# Patient Record
Sex: Female | Born: 1940 | Race: White | Hispanic: No | Marital: Married | Smoking: Never smoker
Health system: Southern US, Community
[De-identification: ages and names within clinical notes are randomized; demographics above are authoritative.]

## PROBLEM LIST (undated history)

## (undated) DIAGNOSIS — E039 Hypothyroidism, unspecified: Secondary | ICD-10-CM

## (undated) DIAGNOSIS — Z923 Personal history of irradiation: Secondary | ICD-10-CM

## (undated) DIAGNOSIS — I10 Essential (primary) hypertension: Secondary | ICD-10-CM

## (undated) DIAGNOSIS — Z87442 Personal history of urinary calculi: Secondary | ICD-10-CM

## (undated) DIAGNOSIS — C50919 Malignant neoplasm of unspecified site of unspecified female breast: Secondary | ICD-10-CM

## (undated) DIAGNOSIS — C801 Malignant (primary) neoplasm, unspecified: Secondary | ICD-10-CM

## (undated) HISTORY — PX: STENT PLACE LEFT URETER (ARMC HX): HXRAD1254

## (undated) HISTORY — PX: CHOLECYSTECTOMY: SHX55

## (undated) HISTORY — PX: OTHER SURGICAL HISTORY: SHX169

## (undated) HISTORY — PX: ABDOMINAL HYSTERECTOMY: SHX81

## (undated) HISTORY — PX: BREAST BIOPSY: SHX20

## (undated) HISTORY — PX: BREAST LUMPECTOMY: SHX2

---

## 1999-02-06 ENCOUNTER — Encounter: Payer: Self-pay | Admitting: *Deleted

## 1999-02-06 ENCOUNTER — Encounter: Admission: RE | Admit: 1999-02-06 | Discharge: 1999-02-06 | Payer: Self-pay | Admitting: *Deleted

## 1999-05-18 ENCOUNTER — Encounter (INDEPENDENT_AMBULATORY_CARE_PROVIDER_SITE_OTHER): Payer: Self-pay | Admitting: Specialist

## 1999-05-18 ENCOUNTER — Ambulatory Visit (HOSPITAL_COMMUNITY): Admission: RE | Admit: 1999-05-18 | Discharge: 1999-05-18 | Payer: Self-pay | Admitting: Gastroenterology

## 2000-02-08 ENCOUNTER — Encounter: Admission: RE | Admit: 2000-02-08 | Discharge: 2000-02-08 | Payer: Self-pay | Admitting: General Surgery

## 2000-02-08 ENCOUNTER — Encounter: Payer: Self-pay | Admitting: General Surgery

## 2001-02-10 ENCOUNTER — Encounter: Admission: RE | Admit: 2001-02-10 | Discharge: 2001-02-10 | Payer: Self-pay | Admitting: General Surgery

## 2001-02-10 ENCOUNTER — Encounter: Payer: Self-pay | Admitting: General Surgery

## 2001-05-21 ENCOUNTER — Encounter: Payer: Self-pay | Admitting: General Surgery

## 2001-05-21 ENCOUNTER — Encounter: Admission: RE | Admit: 2001-05-21 | Discharge: 2001-05-21 | Payer: Self-pay | Admitting: General Surgery

## 2001-05-26 ENCOUNTER — Encounter: Admission: RE | Admit: 2001-05-26 | Discharge: 2001-05-26 | Payer: Self-pay | Admitting: Internal Medicine

## 2001-05-26 ENCOUNTER — Encounter: Payer: Self-pay | Admitting: Internal Medicine

## 2002-02-11 ENCOUNTER — Encounter: Admission: RE | Admit: 2002-02-11 | Discharge: 2002-02-11 | Payer: Self-pay | Admitting: Oncology

## 2002-02-11 ENCOUNTER — Encounter: Payer: Self-pay | Admitting: Oncology

## 2003-02-15 ENCOUNTER — Encounter: Admission: RE | Admit: 2003-02-15 | Discharge: 2003-02-15 | Payer: Self-pay | Admitting: Internal Medicine

## 2004-02-21 ENCOUNTER — Encounter: Admission: RE | Admit: 2004-02-21 | Discharge: 2004-02-21 | Payer: Self-pay | Admitting: Internal Medicine

## 2005-02-19 ENCOUNTER — Encounter: Admission: RE | Admit: 2005-02-19 | Discharge: 2005-02-19 | Payer: Self-pay | Admitting: Internal Medicine

## 2005-03-02 ENCOUNTER — Encounter: Admission: RE | Admit: 2005-03-02 | Discharge: 2005-03-02 | Payer: Self-pay | Admitting: Internal Medicine

## 2006-02-08 ENCOUNTER — Encounter: Admission: RE | Admit: 2006-02-08 | Discharge: 2006-02-08 | Payer: Self-pay | Admitting: General Surgery

## 2006-03-08 ENCOUNTER — Encounter: Admission: RE | Admit: 2006-03-08 | Discharge: 2006-03-08 | Payer: Self-pay | Admitting: Internal Medicine

## 2006-11-05 ENCOUNTER — Emergency Department (HOSPITAL_COMMUNITY): Admission: EM | Admit: 2006-11-05 | Discharge: 2006-11-05 | Payer: Self-pay | Admitting: Emergency Medicine

## 2006-11-05 ENCOUNTER — Ambulatory Visit (HOSPITAL_BASED_OUTPATIENT_CLINIC_OR_DEPARTMENT_OTHER): Admission: RE | Admit: 2006-11-05 | Discharge: 2006-11-05 | Payer: Self-pay | Admitting: Urology

## 2006-11-14 ENCOUNTER — Ambulatory Visit (HOSPITAL_COMMUNITY): Admission: RE | Admit: 2006-11-14 | Discharge: 2006-11-14 | Payer: Self-pay | Admitting: Urology

## 2006-12-10 ENCOUNTER — Encounter: Admission: RE | Admit: 2006-12-10 | Discharge: 2006-12-10 | Payer: Self-pay | Admitting: Internal Medicine

## 2007-03-17 ENCOUNTER — Encounter: Admission: RE | Admit: 2007-03-17 | Discharge: 2007-03-17 | Payer: Self-pay | Admitting: Internal Medicine

## 2007-11-18 IMAGING — US US ABDOMEN LIMITED
1 series · 10 of 10 positions shown · non-contrast
Comparison: none

CLINICAL DATA: 1 cm round low density anterior falciform ligament, left lobe liver, on [HOSPITAL] abdominal CT, 11/05/06.
 LIMITED ABDOMINAL ULTRASOUND:

[Series 1: us abdomen limited · 0.22mm/px · 10 acquisitions, 10 frames shown]
[im 1/10]
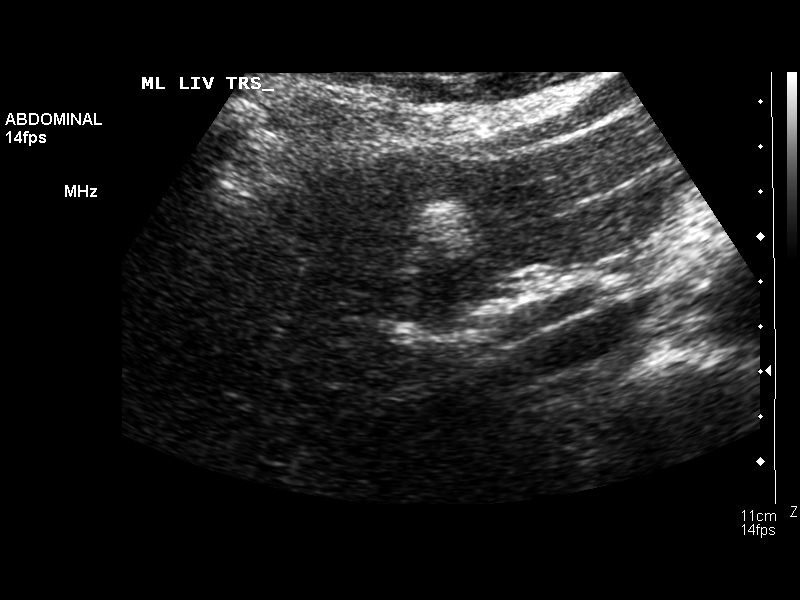
[im 2/10]
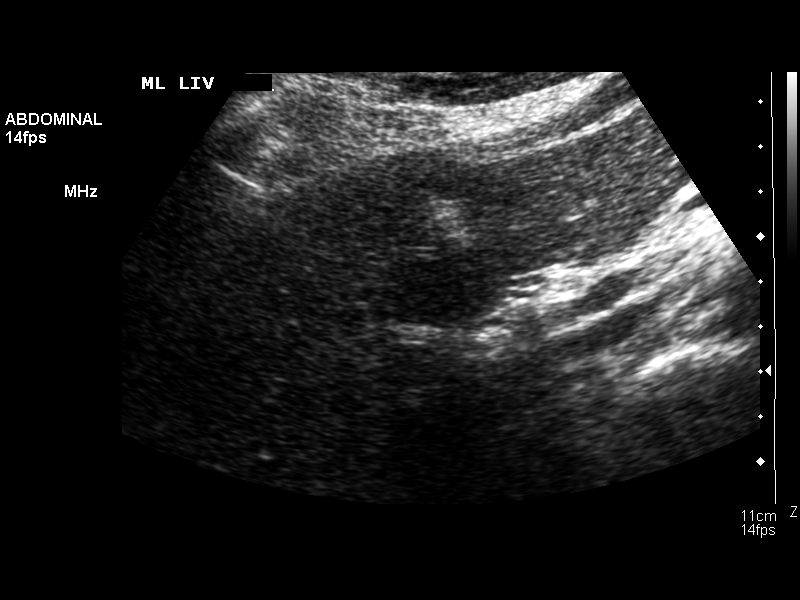
[im 3/10]
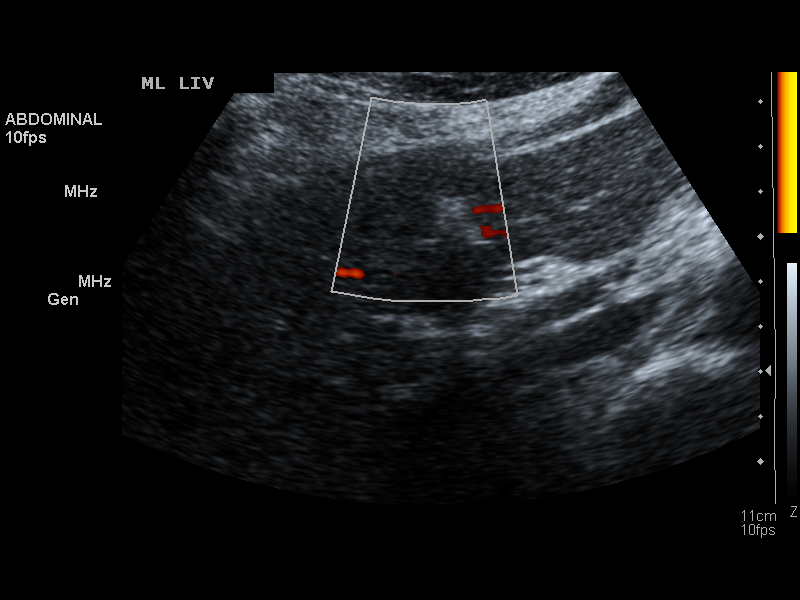
[im 4/10]
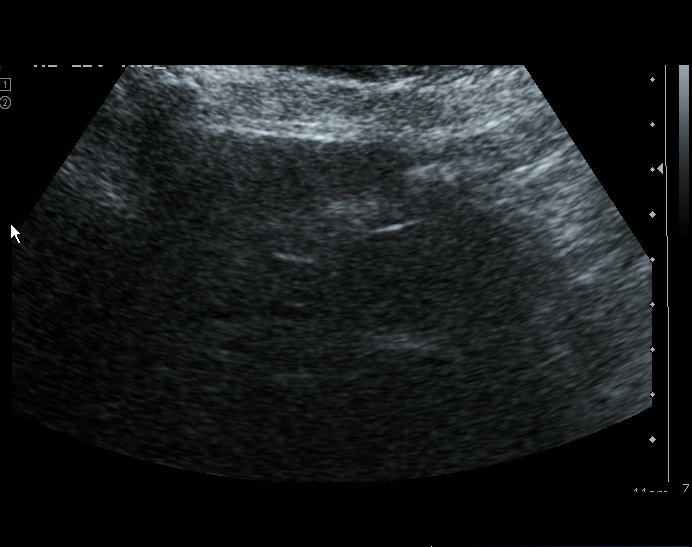
[im 5/10]
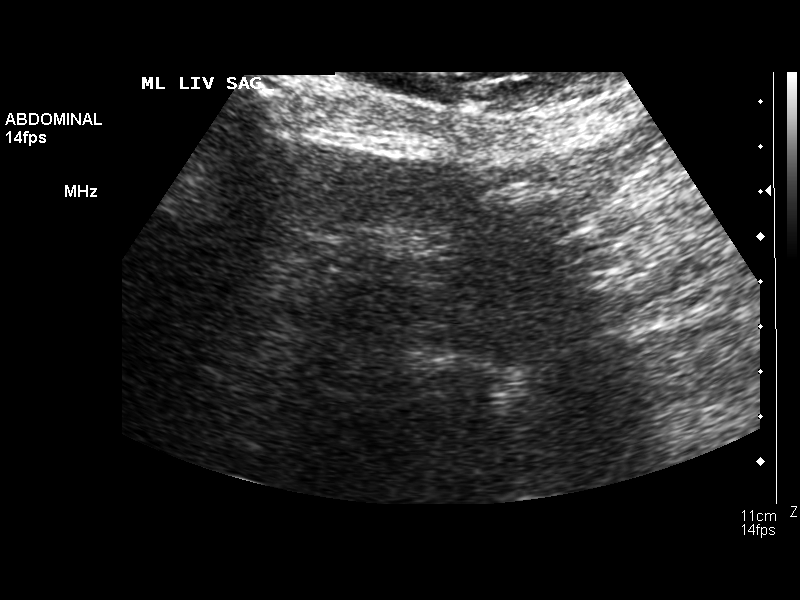
[im 6/10]
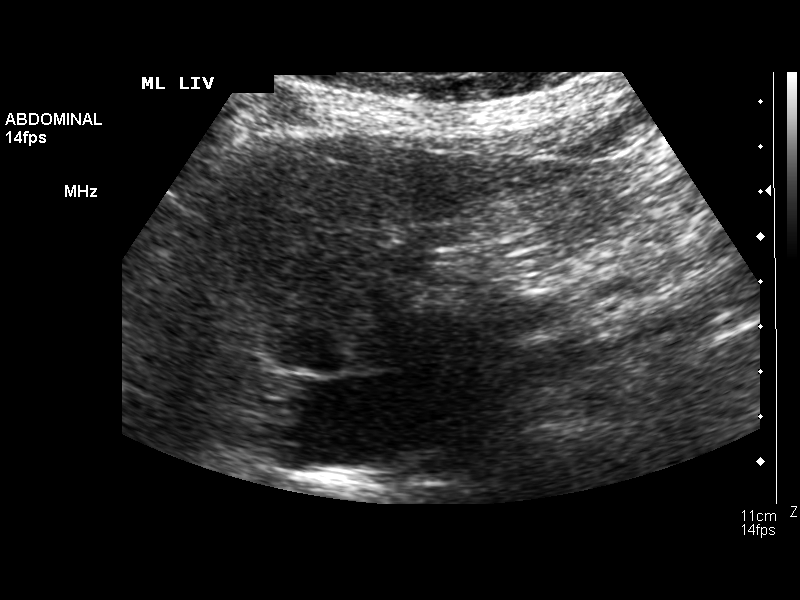
[im 7/10]
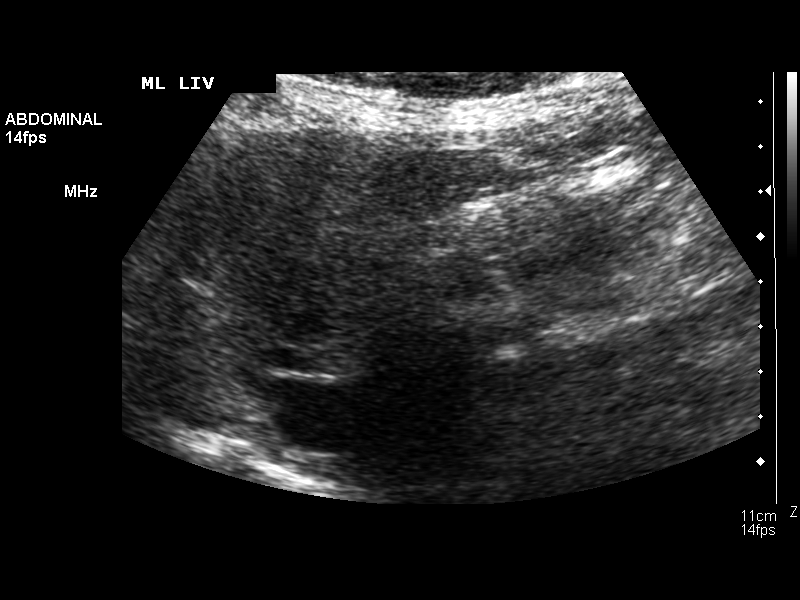
[im 8/10]
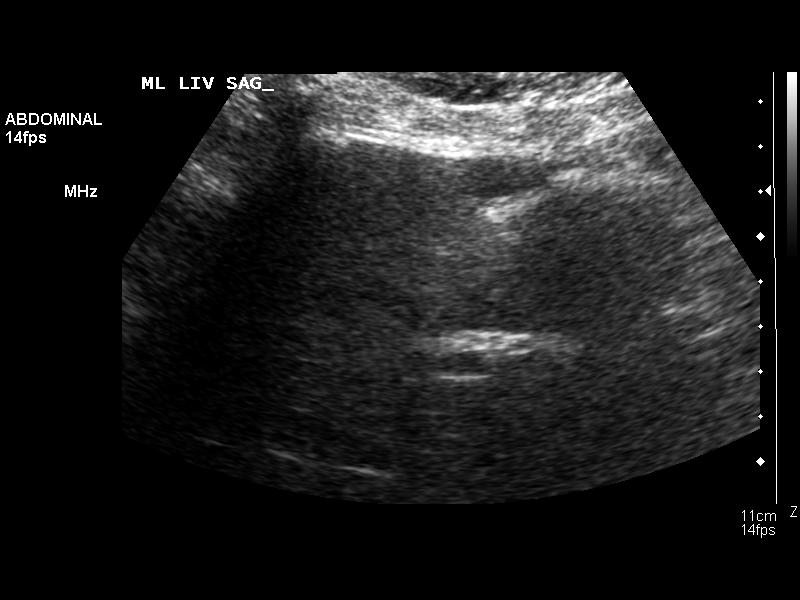
[im 9/10]
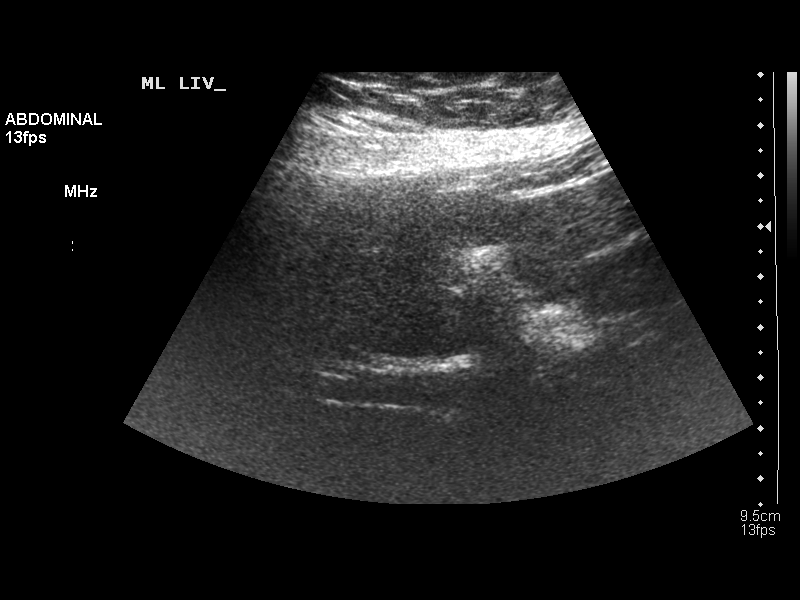
[im 10/10]
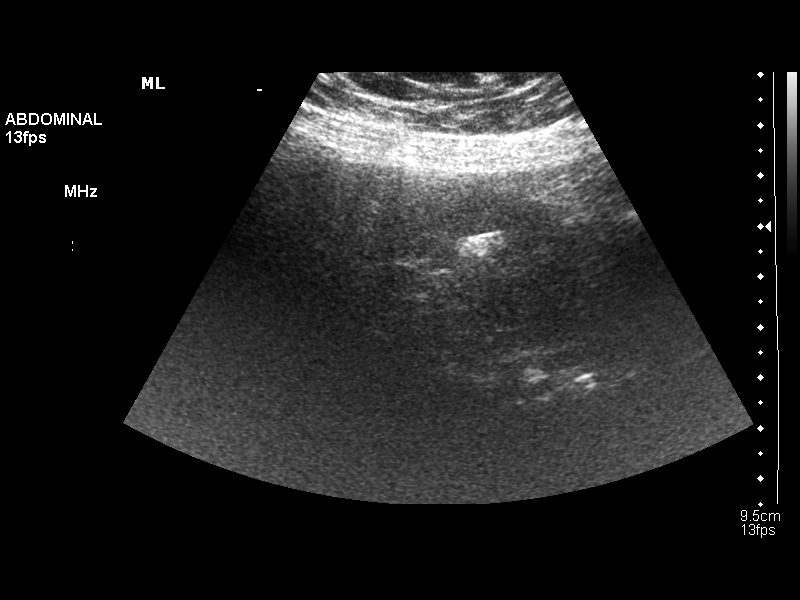

[10 of 10 positions shown; findings below may reference images not displayed]

FINDINGS: Ultrasound targeted in region of abdominal CT focus demonstrates characteristic fat along the falciform ligament.  Small area of echogenicity.  No new focal liver lesion is seen.
IMPRESSION: 1.  1 cm low density is consistent with benign fat along falciform ligament, left lobe liver.
 2.  No other solid nor cystic focal liver lesion seen.

## 2008-03-22 ENCOUNTER — Encounter: Admission: RE | Admit: 2008-03-22 | Discharge: 2008-03-22 | Payer: Self-pay | Admitting: Internal Medicine

## 2009-03-28 ENCOUNTER — Encounter: Admission: RE | Admit: 2009-03-28 | Discharge: 2009-03-28 | Payer: Self-pay | Admitting: Internal Medicine

## 2010-03-28 ENCOUNTER — Encounter
Admission: RE | Admit: 2010-03-28 | Discharge: 2010-03-28 | Payer: Self-pay | Source: Home / Self Care | Attending: Internal Medicine | Admitting: Internal Medicine

## 2010-04-01 ENCOUNTER — Encounter: Payer: Self-pay | Admitting: Internal Medicine

## 2010-07-25 NOTE — Op Note (Signed)
NAMECAITLIN, [name]                ACCOUNT NO.:  [id number]   MEDICAL RECORD NO.:  [id number]          PATIENT TYPE:  AMB   LOCATION:  [facility]                         FACILITY:  [facility]   PHYSICIAN:  [place] Service, M.D.DATE OF BIRTH:  08/03/1940   DATE OF PROCEDURE:  11/05/2006  DATE OF DISCHARGE:                               OPERATIVE REPORT   PREOPERATIVE DIAGNOSIS:  A 70 year old female, intractable flank pain,  nausea, vomiting, despite appropriate medical therapy.  Recent CT shows  stone in the region of the left UPJ.  The patient is on aspirin.  Plan  was made for double-J stent placement with lithotripsy to follow once  she has been off aspirin for about 10 days.   POSTOPERATIVE DIAGNOSIS:  A 70 year old female, intractable flank pain,  nausea, vomiting, despite appropriate medical therapy.  Recent CT shows  stone in the region of the left UPJ.  The patient is on aspirin.  Plan  was made for double-J stent placement with lithotripsy to follow once  she has been off aspirin for about 10 days.   PROCEDURE:  Cystoscopy, retrogrades, double-J stent.   ANESTHESIA:  General.   DRAINS:  6-French 26-cm double-J stent.   DESCRIPTION OF PROCEDURE:  The patient was prepped and draped in the  dorsolithotomy position.  After institution of an adequate level of  general anesthesia, well-lubricated 21-French panendoscope was gently  inserted at the urethral meatus.  Normal urethra and sphincter.  Clear  reflux right orifice; minimal reflux left orifice.  Bladder was  inspected with the 12 and 70 degree lenses.   Blocking catheter was selected, positioned at the right ureteral orifice  with gentle injection of contrast.  The patient had a somewhat tortuous  right ureter, mainly due to her scoliosis, and she had S-shaped  deformity of the ureteropelvic junction but no evidence of obstruction.  After injection of about 5-10 mL of contrast, ureter, pelvis and calyces  were all outlined.   With removal of the catheter, there was prompt  efflux and drainage over the next 3-5 minutes.  Similar technique was  used on the left side.  The patient had what appeared to be a rounded  filling defect above the vessels and a second rounded filling defect at  about the level of the UPJ consistent with the stone identified on CT.  Floppy tip guidewire was then inserted, bypassed the filling defects,  passed into what appeared to be upper pole dilated calyces.  End-hole  catheter was passed over the floppy tip guidewire with prompt efflux of  concentrated urine.  Urine was sent for culture.  Postobstructive  diuresis was allowed to subside.  A 6-French 26-cm  double-J stent was then advanced over the indwelling guidewire with  excellent pigtail formation on guidewire removal.  Bladder was drained.  Cystoscope was removed.  The patient was returned to recovery in  satisfactory condition.           ______________________________  Boston Service, M.D.     RH/MEDQ  D:  11/05/2006  T:  11/06/2006  Job:  [id number]   cc:  [place] Service, M.D.  Fax: [phone]

## 2010-12-22 LAB — URINE MICROSCOPIC-ADD ON

## 2010-12-22 LAB — I-STAT 8, (EC8 V) (CONVERTED LAB)
BUN: 19
Bicarbonate: 26.4 — ABNORMAL HIGH
Chloride: 104
Glucose, Bld: 147 — ABNORMAL HIGH
HCT: 37
Hemoglobin: 12.6
Operator id: 134391
Potassium: 4.1
Sodium: 139
TCO2: 28
pCO2, Ven: 47.6
pH, Ven: 7.352 — ABNORMAL HIGH

## 2010-12-22 LAB — ANAEROBIC CULTURE: Gram Stain: NONE SEEN

## 2010-12-22 LAB — URINALYSIS, ROUTINE W REFLEX MICROSCOPIC
Glucose, UA: NEGATIVE
Ketones, ur: 15 — AB
Nitrite: NEGATIVE
Protein, ur: 100 — AB
Specific Gravity, Urine: 1.025
Urobilinogen, UA: 0.2
pH: 5

## 2010-12-22 LAB — WOUND CULTURE
Culture: NO GROWTH
Gram Stain: NONE SEEN

## 2010-12-22 LAB — POCT HEMOGLOBIN-HEMACUE
Hemoglobin: 13.3
Operator id: 268271

## 2011-02-26 ENCOUNTER — Other Ambulatory Visit: Payer: Self-pay | Admitting: Internal Medicine

## 2011-02-26 DIAGNOSIS — Z1231 Encounter for screening mammogram for malignant neoplasm of breast: Secondary | ICD-10-CM

## 2011-04-11 ENCOUNTER — Ambulatory Visit
Admission: RE | Admit: 2011-04-11 | Discharge: 2011-04-11 | Disposition: A | Discharge status: Home / Self Care | Payer: Medicare Other | Source: Ambulatory Visit | Attending: Internal Medicine | Admitting: Internal Medicine

## 2011-04-11 DIAGNOSIS — Z1231 Encounter for screening mammogram for malignant neoplasm of breast: Secondary | ICD-10-CM

## 2012-03-10 ENCOUNTER — Other Ambulatory Visit: Payer: Self-pay | Admitting: Internal Medicine

## 2012-03-10 DIAGNOSIS — Z853 Personal history of malignant neoplasm of breast: Secondary | ICD-10-CM

## 2012-03-10 DIAGNOSIS — Z1231 Encounter for screening mammogram for malignant neoplasm of breast: Secondary | ICD-10-CM

## 2012-03-10 DIAGNOSIS — Z9889 Other specified postprocedural states: Secondary | ICD-10-CM

## 2012-04-14 ENCOUNTER — Ambulatory Visit
Admission: RE | Admit: 2012-04-14 | Discharge: 2012-04-14 | Disposition: A | Discharge status: Home / Self Care | Payer: Medicare Other | Source: Ambulatory Visit | Attending: Internal Medicine | Admitting: Internal Medicine

## 2012-04-14 DIAGNOSIS — Z1231 Encounter for screening mammogram for malignant neoplasm of breast: Secondary | ICD-10-CM

## 2012-04-14 DIAGNOSIS — Z853 Personal history of malignant neoplasm of breast: Secondary | ICD-10-CM

## 2012-04-14 DIAGNOSIS — Z9889 Other specified postprocedural states: Secondary | ICD-10-CM

## 2013-03-18 ENCOUNTER — Other Ambulatory Visit: Payer: Self-pay

## 2013-03-18 DIAGNOSIS — Z1231 Encounter for screening mammogram for malignant neoplasm of breast: Secondary | ICD-10-CM

## 2013-04-20 ENCOUNTER — Ambulatory Visit
Admission: RE | Admit: 2013-04-20 | Discharge: 2013-04-20 | Disposition: A | Discharge status: Home / Self Care | Payer: Medicare Other | Source: Ambulatory Visit

## 2013-04-20 DIAGNOSIS — Z1231 Encounter for screening mammogram for malignant neoplasm of breast: Secondary | ICD-10-CM

## 2014-03-22 ENCOUNTER — Other Ambulatory Visit: Payer: Self-pay

## 2014-03-22 DIAGNOSIS — Z1231 Encounter for screening mammogram for malignant neoplasm of breast: Secondary | ICD-10-CM

## 2014-05-03 ENCOUNTER — Encounter (INDEPENDENT_AMBULATORY_CARE_PROVIDER_SITE_OTHER): Payer: Self-pay

## 2014-05-03 ENCOUNTER — Ambulatory Visit
Admission: RE | Admit: 2014-05-03 | Discharge: 2014-05-03 | Disposition: A | Discharge status: Home / Self Care | Payer: Medicare Other | Source: Ambulatory Visit

## 2014-05-03 DIAGNOSIS — Z1231 Encounter for screening mammogram for malignant neoplasm of breast: Secondary | ICD-10-CM

## 2015-04-12 ENCOUNTER — Other Ambulatory Visit: Payer: Self-pay

## 2015-04-12 DIAGNOSIS — Z1231 Encounter for screening mammogram for malignant neoplasm of breast: Secondary | ICD-10-CM

## 2015-04-28 ENCOUNTER — Other Ambulatory Visit: Payer: Self-pay | Admitting: Orthopaedic Surgery

## 2015-04-28 DIAGNOSIS — M25562 Pain in left knee: Secondary | ICD-10-CM

## 2015-05-05 ENCOUNTER — Ambulatory Visit
Admission: RE | Admit: 2015-05-05 | Discharge: 2015-05-05 | Disposition: A | Discharge status: Home / Self Care | Payer: Medicare Other | Source: Ambulatory Visit | Attending: Orthopaedic Surgery | Admitting: Orthopaedic Surgery

## 2015-05-05 DIAGNOSIS — M25562 Pain in left knee: Secondary | ICD-10-CM

## 2015-05-07 ENCOUNTER — Emergency Department (HOSPITAL_COMMUNITY)
Admission: EM | Admit: 2015-05-07 | Discharge: 2015-05-07 | Disposition: A | Discharge status: Home / Self Care | Payer: Medicare Other | Attending: Emergency Medicine | Admitting: Emergency Medicine

## 2015-05-07 ENCOUNTER — Emergency Department (HOSPITAL_COMMUNITY): Payer: Medicare Other

## 2015-05-07 ENCOUNTER — Encounter (HOSPITAL_COMMUNITY): Payer: Self-pay | Admitting: Oncology

## 2015-05-07 DIAGNOSIS — N23 Unspecified renal colic: Secondary | ICD-10-CM

## 2015-05-07 DIAGNOSIS — Z79899 Other long term (current) drug therapy: Secondary | ICD-10-CM | POA: Diagnosis not present

## 2015-05-07 DIAGNOSIS — I10 Essential (primary) hypertension: Secondary | ICD-10-CM | POA: Insufficient documentation

## 2015-05-07 DIAGNOSIS — R109 Unspecified abdominal pain: Secondary | ICD-10-CM | POA: Diagnosis present

## 2015-05-07 HISTORY — DX: Essential (primary) hypertension: I10

## 2015-05-07 LAB — BASIC METABOLIC PANEL
Anion gap: 7 (ref 5–15)
BUN: 24 mg/dL — ABNORMAL HIGH (ref 6–20)
CO2: 27 mmol/L (ref 22–32)
CREATININE: 0.77 mg/dL (ref 0.44–1.00)
Calcium: 9.8 mg/dL (ref 8.9–10.3)
Chloride: 112 mmol/L — ABNORMAL HIGH (ref 101–111)
Glucose, Bld: 98 mg/dL (ref 65–99)
Potassium: 4.6 mmol/L (ref 3.5–5.1)
SODIUM: 146 mmol/L — AB (ref 135–145)

## 2015-05-07 LAB — URINALYSIS, ROUTINE W REFLEX MICROSCOPIC
Bilirubin Urine: NEGATIVE
Glucose, UA: NEGATIVE mg/dL
KETONES UR: NEGATIVE mg/dL
NITRITE: NEGATIVE
PH: 6 (ref 5.0–8.0)
Protein, ur: NEGATIVE mg/dL
Specific Gravity, Urine: 1.015 (ref 1.005–1.030)

## 2015-05-07 LAB — CBC WITH DIFFERENTIAL/PLATELET
BASOS PCT: 0 %
Basophils Absolute: 0 10*3/uL (ref 0.0–0.1)
EOS ABS: 0.2 10*3/uL (ref 0.0–0.7)
Eosinophils Relative: 2 %
HEMATOCRIT: 41.7 % (ref 36.0–46.0)
Hemoglobin: 13.8 g/dL (ref 12.0–15.0)
Lymphocytes Relative: 24 %
Lymphs Abs: 2 10*3/uL (ref 0.7–4.0)
MCH: 30.3 pg (ref 26.0–34.0)
MCHC: 33.1 g/dL (ref 30.0–36.0)
MCV: 91.4 fL (ref 78.0–100.0)
MONO ABS: 0.6 10*3/uL (ref 0.1–1.0)
Monocytes Relative: 7 %
NEUTROS ABS: 5.6 10*3/uL (ref 1.7–7.7)
NEUTROS PCT: 67 %
PLATELETS: 191 10*3/uL (ref 150–400)
RBC: 4.56 MIL/uL (ref 3.87–5.11)
RDW: 13.3 % (ref 11.5–15.5)
WBC: 8.4 10*3/uL (ref 4.0–10.5)

## 2015-05-07 LAB — URINE MICROSCOPIC-ADD ON

## 2015-05-07 MED ORDER — HYDROMORPHONE HCL 1 MG/ML IJ SOLN
0.5000 mg | Freq: Once | INTRAMUSCULAR | Status: AC
  Administered 2015-05-07: 0.5 mg via INTRAVENOUS
  Filled 2015-05-07: qty 1

## 2015-05-07 MED ORDER — ONDANSETRON HCL 4 MG/2ML IJ SOLN
4.0000 mg | Freq: Once | INTRAMUSCULAR | Status: AC
  Administered 2015-05-07: 4 mg via INTRAVENOUS
  Filled 2015-05-07: qty 2

## 2015-05-07 MED ORDER — OXYCODONE-ACETAMINOPHEN 5-325 MG PO TABS: 1.0000 | ORAL_TABLET | ORAL | Status: DC | PRN

## 2015-05-07 NOTE — ED Notes (Signed)
Pt c/o left sided flank pain.  Pt has had a kidney stone in the past that was too large to pass.  Reports that this pain is similar.  Rates pain 9/10, sharp in nature.

## 2015-05-07 NOTE — Discharge Instructions (Signed)
Kidney Stones °Kidney stones (urolithiasis) are deposits that form inside your kidneys. The intense pain is caused by the stone moving through the urinary tract. When the stone moves, the ureter goes into spasm around the stone. The stone is usually passed in the urine.  °CAUSES  °· A disorder that makes certain neck glands produce too much parathyroid hormone (primary hyperparathyroidism). °· A buildup of uric acid crystals, similar to gout in your joints. °· Narrowing (stricture) of the ureter. °· A kidney obstruction present at birth (congenital obstruction). °· Previous surgery on the kidney or ureters. °· Numerous kidney infections. °SYMPTOMS  °· Feeling sick to your stomach (nauseous). °· Throwing up (vomiting). °· Blood in the urine (hematuria). °· Pain that usually spreads (radiates) to the groin. °· Frequency or urgency of urination. °DIAGNOSIS  °· Taking a history and physical exam. °· Blood or urine tests. °· CT scan. °· Occasionally, an examination of the inside of the urinary bladder (cystoscopy) is performed. °TREATMENT  °· Observation. °· Increasing your fluid intake. °· Extracorporeal shock wave lithotripsy--This is a noninvasive procedure that uses shock waves to break up kidney stones. °· Surgery may be needed if you have severe pain or persistent obstruction. There are various surgical procedures. Most of the procedures are performed with the use of small instruments. Only small incisions are needed to accommodate these instruments, so recovery time is minimized. °The size, location, and chemical composition are all important variables that will determine the proper choice of action for you. Talk to your health care provider to better understand your situation so that you will minimize the risk of injury to yourself and your kidney.  °HOME CARE INSTRUCTIONS  °· Drink enough water and fluids to keep your urine clear or pale yellow. This will help you to pass the stone or stone fragments. °· Strain  all urine through the provided strainer. Keep all particulate matter and stones for your health care provider to see. The stone causing the pain may be as small as a grain of salt. It is very important to use the strainer each and every time you pass your urine. The collection of your stone will allow your health care provider to analyze it and verify that a stone has actually passed. The stone analysis will often identify what you can do to reduce the incidence of recurrences. °· Only take over-the-counter or prescription medicines for pain, discomfort, or fever as directed by your health care provider. °· Keep all follow-up visits as told by your health care provider. This is important. °· Get follow-up X-rays if required. The absence of pain does not always mean that the stone has passed. It may have only stopped moving. If the urine remains completely obstructed, it can cause loss of kidney function or even complete destruction of the kidney. It is your responsibility to make sure X-rays and follow-ups are completed. Ultrasounds of the kidney can show blockages and the status of the kidney. Ultrasounds are not associated with any radiation and can be performed easily in a matter of minutes. °· Make changes to your daily diet as told by your health care provider. You may be told to: °¨ Limit the amount of salt that you eat. °¨ Eat 5 or more servings of fruits and vegetables each day. °¨ Limit the amount of meat, poultry, fish, and eggs that you eat. °· Collect a 24-hour urine sample as told by your health care provider. You may need to collect another urine sample every 6-12   months. °SEEK MEDICAL CARE IF: °· You experience pain that is progressive and unresponsive to any pain medicine you have been prescribed. °SEEK IMMEDIATE MEDICAL CARE IF:  °· Pain cannot be controlled with the prescribed medicine. °· You have a fever or shaking chills. °· The severity or intensity of pain increases over 18 hours and is not  relieved by pain medicine. °· You develop a new onset of abdominal pain. °· You feel faint or pass out. °· You are unable to urinate. °  °This information is not intended to replace advice given to you by your health care provider. Make sure you discuss any questions you have with your health care provider. °  °Document Released: 02/26/2005 Document Revised: 11/17/2014 Document Reviewed: 07/30/2012 °Elsevier Interactive Patient Education ©2016 [employer] ° °

## 2015-05-07 NOTE — ED Provider Notes (Signed)
CSN: [id number]     Arrival date & time 05/07/15  0449 History   First MD Initiated Contact with Patient 05/07/15 [phone]     Chief Complaint  Patient presents with  . Flank Pain     (Consider location/radiation/quality/duration/timing/severity/associated sxs/prior Treatment) HPI Comments: Patient presents to the emergency department for evaluation of left flank pain. Patient reports that symptoms began several hours ago. Pain is constant, sharp, stabbing and severe. She reports that she had 2 other episodes of pain that was similar over the past week, but each of those only lasted a short period of time and then resolved. She has had previous kidney stone with similar pain.  Patient is a 75 y.o. female presenting with flank pain.  Flank Pain    Past Medical History  Diagnosis Date  . Kidney stone   . Hypertension    Past Surgical History  Procedure Laterality Date  . Stent place left ureter ([facility] hx)     No family history on file. Social History  Substance Use Topics  . Smoking status: Never Smoker   . Smokeless tobacco: Never Used  . Alcohol Use: No   OB History    No data available     Review of Systems  Genitourinary: Positive for flank pain.  All other systems reviewed and are negative.     Allergies  Review of patient's allergies indicates no known allergies.  Home Medications   Prior to Admission medications   Medication Sig Start Date End Date Taking? Authorizing Provider  amLODipine (NORVASC) 5 MG tablet Take 5 mg by mouth daily. 05/02/15  Yes Historical Provider, MD  dorzolamide-timolol (COSOPT) 22.3-6.8 MG/ML ophthalmic solution Place 1 drop into both eyes 2 (two) times daily. 05/02/15  Yes Historical Provider, MD  gabapentin (NEURONTIN) 100 MG capsule Take 200 mg by mouth every evening. 05/02/15  Yes Historical Provider, MD  latanoprost (XALATAN) 0.005 % ophthalmic solution Place 1 drop into both eyes at bedtime. 05/03/15  Yes Historical Provider, MD   lisinopril (PRINIVIL,ZESTRIL) 20 MG tablet Take 20 mg by mouth daily. 04/12/15  Yes Historical Provider, MD  SYNTHROID 100 MCG tablet Take 100 mcg by mouth daily. 05/02/15  Yes Historical Provider, MD  oxyCODONE-acetaminophen (PERCOCET) 5-325 MG tablet Take 1 tablet by mouth every 4 (four) hours as needed. 05/07/15   Orpah Greek, MD   BP 128/80 mmHg  Pulse 59  Temp(Src) 98.2 F (36.8 C) (Oral)  Resp 18  SpO2 96% Physical Exam  Constitutional: She is oriented to person, place, and time. She appears well-developed and well-nourished. She appears distressed.  HENT:  Head: Normocephalic and atraumatic.  Right Ear: Hearing normal.  Left Ear: Hearing normal.  Nose: Nose normal.  Mouth/Throat: Oropharynx is clear and moist and mucous membranes are normal.  Eyes: Conjunctivae and EOM are normal. Pupils are equal, round, and reactive to light.  Neck: Normal range of motion. Neck supple.  Cardiovascular: Regular rhythm, S1 normal and S2 normal.  Exam reveals no gallop and no friction rub.   No murmur heard. Pulmonary/Chest: Effort normal and breath sounds normal. No respiratory distress. She exhibits no tenderness.  Abdominal: Soft. Normal appearance and bowel sounds are normal. There is no hepatosplenomegaly. There is no tenderness. There is no rebound, no guarding, no tenderness at McBurney's point and negative Murphy's sign. No hernia.  Musculoskeletal: Normal range of motion.  Neurological: She is alert and oriented to person, place, and time. She has normal strength. No cranial nerve deficit or sensory  deficit. Coordination normal. GCS eye subscore is 4. GCS verbal subscore is 5. GCS motor subscore is 6.  Skin: Skin is warm, dry and intact. No rash noted. No cyanosis.  Psychiatric: She has a normal mood and affect. Her speech is normal and behavior is normal. Thought content normal.  Nursing note and vitals reviewed.   ED Course  Procedures (including critical care time) Labs  Review Labs Reviewed  URINALYSIS, ROUTINE W REFLEX MICROSCOPIC (NOT AT [facility]) - Abnormal; Notable for the following:    Hgb urine dipstick TRACE (*)    Leukocytes, UA SMALL (*)    All other components within normal limits  BASIC METABOLIC PANEL - Abnormal; Notable for the following:    Sodium 146 (*)    Chloride 112 (*)    BUN 24 (*)    All other components within normal limits  URINE MICROSCOPIC-ADD ON - Abnormal; Notable for the following:    Squamous Epithelial / LPF 0-5 (*)    Bacteria, UA FEW (*)    All other components within normal limits  CBC WITH DIFFERENTIAL/PLATELET    Imaging Review Mr Knee Left  Wo Contrast  05/05/2015  CLINICAL DATA:  Left knee pain for 6 months. EXAM: MRI OF THE LEFT KNEE WITHOUT CONTRAST TECHNIQUE: Multiplanar, multisequence MR imaging of the knee was performed. No intravenous contrast was administered. COMPARISON:  None. FINDINGS: MENISCI Medial meniscus:  Unremarkable Lateral meniscus:  Unremarkable LIGAMENTS Cruciates:  Unremarkable Collaterals:  Unremarkable CARTILAGE Patellofemoral: Moderate degenerative chondral thinning especially along the medial facet and posterior patellar ridge. Mild chondral fissuring medially along the lateral patellar facet, image 8 series 6. Medial:  Moderate degenerative chondral thinning. Lateral:  Mild degenerative chondral thinning. Joint:  Unremarkable Popliteal Fossa: Baker's cyst with an unusual degree of multilocular at a extending cephalad, size 8.7 by 2.2 by 2.1 cm. Extensor Mechanism:  Unremarkable Bones: No significant extra-articular osseous abnormalities identified. IMPRESSION: 1. Moderate to large sized Baker's cyst with an unusual degree of multi lobularity, extending primarily cephalad. 2. Moderate degrees of degenerative chondral thinning most notably in the patellofemoral joint and medial compartment. Mild chondral fissuring medially along the lateral patellar facet. Electronically Signed   By: Van Clines  M.D.   On: 05/05/2015 09:10   Ct Renal Stone Study  05/07/2015  CLINICAL DATA:  Acute onset of left-sided flank pain. Initial encounter. EXAM: CT ABDOMEN AND PELVIS WITHOUT CONTRAST TECHNIQUE: Multidetector CT imaging of the abdomen and pelvis was performed following the standard protocol without IV contrast. COMPARISON:  CT of the abdomen and pelvis performed 11/05/2006, and right upper quadrant ultrasound performed 12/10/2006 FINDINGS: The visualized lung bases are clear. The liver and spleen are unremarkable in appearance. The patient is status post cholecystectomy, with clips noted at the gallbladder fossa. The pancreas and adrenal glands are unremarkable. Relatively severe left-sided hydronephrosis is noted, with a large 1.0 x 0.7 cm obstructing stone proximally at the left ureteropelvic junction. Mild nonspecific left-sided perinephric stranding is noted. Mild right-sided pelvicaliectasis remains within normal limits. A 1.9 cm parapelvic cyst is noted on the right side. No free fluid is identified. The small bowel is unremarkable in appearance. The stomach is within normal limits. No acute vascular abnormalities are seen. Mild scattered calcification is noted along the abdominal aorta and its branches. The appendix is not definitely characterized; there is no evidence of appendicitis. The colon is grossly unremarkable in appearance. The bladder is decompressed and not well seen. The patient is status post hysterectomy. No suspicious  adnexal masses are seen. No inguinal lymphadenopathy is seen. No acute osseous abnormalities are identified. Mild left convex lumbar scoliosis is noted. IMPRESSION: 1. Relatively severe left-sided hydronephrosis, with a large 1.0 x 0.7 cm obstructing stone proximally at the left ureteropelvic junction. 2. Small right renal cyst noted. 3. Mild scattered calcification along the abdominal aorta its branches. 4. Mild left convex lumbar scoliosis noted. Electronically Signed   By:  Garald Balding M.D.   On: 05/07/2015 06:30   I have personally reviewed and evaluated these images and lab results as part of my medical decision-making.   EKG Interpretation None      MDM   Final diagnoses:  Renal colic on left side    Patient presents to the ER for evaluation of severe left flank pain. Workup shows large kidney stone with hydronephrosis. Patient has had significant improvement in her pain with a single dose of Dilaudid. Results of CT scan were discussed with Dr. Alinda Money, on call for urology.he recommends discharge with analgesia, return to the ER for fever or increasing pain. Otherwise will follow-up in the office Monday.    Orpah Greek, MD 05/07/15 [phone]

## 2015-05-09 ENCOUNTER — Other Ambulatory Visit: Payer: Self-pay | Admitting: Urology

## 2015-05-11 ENCOUNTER — Encounter (HOSPITAL_COMMUNITY): Payer: Self-pay | Admitting: *Deleted

## 2015-05-12 ENCOUNTER — Ambulatory Visit (HOSPITAL_COMMUNITY)
Admission: RE | Admit: 2015-05-12 | Discharge: 2015-05-12 | Disposition: A | Discharge status: Home / Self Care | Payer: Medicare Other | Source: Ambulatory Visit | Attending: Urology | Admitting: Urology

## 2015-05-12 ENCOUNTER — Encounter (HOSPITAL_COMMUNITY)
Admission: RE | Disposition: A | Discharge status: Home / Self Care | Payer: Self-pay | Source: Ambulatory Visit | Attending: Urology

## 2015-05-12 ENCOUNTER — Ambulatory Visit (HOSPITAL_COMMUNITY): Payer: Medicare Other

## 2015-05-12 ENCOUNTER — Encounter (HOSPITAL_COMMUNITY): Payer: Self-pay | Admitting: *Deleted

## 2015-05-12 DIAGNOSIS — Z79899 Other long term (current) drug therapy: Secondary | ICD-10-CM | POA: Insufficient documentation

## 2015-05-12 DIAGNOSIS — E119 Type 2 diabetes mellitus without complications: Secondary | ICD-10-CM | POA: Insufficient documentation

## 2015-05-12 DIAGNOSIS — Z853 Personal history of malignant neoplasm of breast: Secondary | ICD-10-CM | POA: Diagnosis not present

## 2015-05-12 DIAGNOSIS — H409 Unspecified glaucoma: Secondary | ICD-10-CM | POA: Insufficient documentation

## 2015-05-12 DIAGNOSIS — Z87442 Personal history of urinary calculi: Secondary | ICD-10-CM | POA: Insufficient documentation

## 2015-05-12 DIAGNOSIS — E039 Hypothyroidism, unspecified: Secondary | ICD-10-CM | POA: Insufficient documentation

## 2015-05-12 DIAGNOSIS — N201 Calculus of ureter: Secondary | ICD-10-CM

## 2015-05-12 DIAGNOSIS — I10 Essential (primary) hypertension: Secondary | ICD-10-CM | POA: Insufficient documentation

## 2015-05-12 SURGERY — LITHOTRIPSY, ESWL
Anesthesia: LOCAL | Laterality: Left

## 2015-05-12 MED ORDER — DIAZEPAM 5 MG PO TABS
10.0000 mg | ORAL_TABLET | ORAL | Status: AC
  Administered 2015-05-12: 10 mg via ORAL
  Filled 2015-05-12: qty 2

## 2015-05-12 MED ORDER — DIPHENHYDRAMINE HCL 25 MG PO CAPS
25.0000 mg | ORAL_CAPSULE | ORAL | Status: AC
  Administered 2015-05-12: 25 mg via ORAL
  Filled 2015-05-12: qty 1

## 2015-05-12 MED ORDER — CIPROFLOXACIN HCL 500 MG PO TABS
500.0000 mg | ORAL_TABLET | ORAL | Status: AC
  Administered 2015-05-12: 500 mg via ORAL
  Filled 2015-05-12: qty 1

## 2015-05-12 MED ORDER — TAMSULOSIN HCL 0.4 MG PO CAPS: 0.4000 mg | ORAL_CAPSULE | Freq: Every day | ORAL | Status: DC

## 2015-05-12 MED ORDER — SODIUM CHLORIDE 0.9 % IV SOLN
INTRAVENOUS | Status: DC
  Administered 2015-05-12: 08:00:00 via INTRAVENOUS

## 2015-05-12 NOTE — H&P (Signed)
Chief Complaint Left ureteral stone   History of Present Illness The patient is a 75 year old female with a 9.5 mm proximal left ureteral stone. The patient has been experiencing pain intermittently for the last week and half. She had about approximately 2 days ago with pain which brought her to the emergency room. Her pain is very well controlled at this point. She does have oxycodone which is taken twice. She denies any fevers or chills. This is her second stone. Her first stone was treated with lithotripsy.   Past Medical History Problems  1. History of Breast Cancer 2. History of Calculus of ureter (N20.1) 3. History of breast cancer (Z85.3) 4. History of diabetes mellitus (Z86.39) 5. History of glaucoma (Z86.69) 6. History of glaucoma (Z86.69) 7. History of hypertension (Z86.79) 8. History of hypothyroidism (Z86.39) 9. History of kidney stones (Z87.442) 10. History of Murmur (R01.1) 11. History of Nephrolithiasis Of The Left Kidney  Surgical History Problems  1. History of Appendectomy 2. History of Breast Surgery Lumpectomy 3. History of Gallbladder Surgery 4. History of Hysterectomy  Current Meds 1. AmLODIPine Besylate TABS;  Therapy: (Recorded:27Feb2017) to Recorded 2. Dorzolamide HCl - 2 % Ophthalmic Solution;  Therapy: (Recorded:27Feb2017) to Recorded 3. Gabapentin CAPS;  Therapy: (Recorded:27Feb2017) to Recorded 4. Latanoprost 0.005 % Ophthalmic Solution;  Therapy: (Recorded:27Feb2017) to Recorded 5. Lisinopril TABS;  Therapy: (Recorded:27Feb2017) to Recorded 6. Synthroid TABS;  Therapy: (Recorded:27Feb2017) to Recorded 7. Synthroid TABS;  Therapy: (Recorded:28Aug2008) to Recorded  Allergies Medication  1. Percocet TABS  Family History Problems  1. Family history of Death In The Family Father   Age 82 2. Family history of Death In The Family Mother   Age 57 3. Family history of cardiac disorder (Z82.49) : Mother, Father  Social History Problems    Denied: Alcohol Use (History)   Caffeine Use   1 a day   Marital History - Currently Married   Married   Number of children   1 son / 1 daughter   Retired   Denied: Tobacco Use  Review of Systems Genitourinary, constitutional, skin, eye, otolaryngeal, hematologic/lymphatic, cardiovascular, pulmonary, endocrine, musculoskeletal, gastrointestinal, neurological and psychiatric system(s) were reviewed and pertinent findings if present are noted and are otherwise negative.  Genitourinary: nocturia and urinary stream starts and stops.  Gastrointestinal: constipation.    Physical Exam Constitutional: Well nourished . No acute distress.  ENT:. The ears and nose are normal in appearance.  Neck: The appearance of the neck is normal.  Pulmonary: No respiratory distress.  Cardiovascular:. No peripheral edema.  Abdomen: The abdomen is not distended. The abdomen is soft and nontender. No CVA tenderness.  Skin: Normal skin turgor and no visible rash.  Neuro/Psych:. Mood and affect are appropriate. No focal sensory deficits.    Results/Data Urine [Data Includes: Last 1 Day]   [id number]  COLOR YELLOW   APPEARANCE CLEAR   SPECIFIC GRAVITY 1.015   pH 7.0   GLUCOSE NEGATIVE   BILIRUBIN NEGATIVE   KETONE NEGATIVE   BLOOD TRACE   PROTEIN NEGATIVE   NITRITE NEGATIVE   LEUKOCYTE ESTERASE NEGATIVE   SQUAMOUS EPITHELIAL/HPF 0-5 HPF  WBC 0-5 WBC/HPF  RBC 0-2 RBC/HPF  BACTERIA NONE SEEN HPF  CRYSTALS NONE SEEN HPF  CASTS NONE SEEN LPF  Yeast NONE SEEN HPF   KUB:  Normal bony structures. Left proximal ureteral stone approximately 1 cm in size is visible.    Impression:  1 cm proximal left ureteral stone   Assessment Assessed  1. Nephrolithiasis (N20.0)  I had a long discussion with the patient regarding treatment options for her stone which include lithotripsy and ureteroscopy. She elected proceed with lithotripsy if she had this in the past with great success. She  understands the risks include but are not limited to bleeding, infection, and repeat procedures for stone fragments do not pass. All questions were answered. All risks, benefits and indications were also described.   Plan Health Maintenance  1. UA With REFLEX; [Do Not Release]; Status:Complete;   [id number] 09:46AM Nephrolithiasis  2. KUB; Status:Resulted - Requires Verification;   [id number] 11:22AM 3. URINE CULTURE; Status:In Progress - Specimen/Data Collected;   Done: [id number]  1. Left ureteral stone   -left ESWL  -urine culture

## 2015-05-12 NOTE — Op Note (Signed)
See [facility] OP note scanned into chart. Also because of the size, density, location and other factors that cannot be anticipated I feel this will likely be a staged procedure. This fact supersedes any indication in the scanned [facility] operative note to the contrary.  

## 2015-05-12 NOTE — Discharge Instructions (Signed)
See [facility] discharge instructions in chart.  

## 2015-05-16 ENCOUNTER — Ambulatory Visit
Admission: RE | Admit: 2015-05-16 | Discharge: 2015-05-16 | Disposition: A | Discharge status: Home / Self Care | Payer: Medicare Other | Source: Ambulatory Visit

## 2015-05-16 DIAGNOSIS — Z1231 Encounter for screening mammogram for malignant neoplasm of breast: Secondary | ICD-10-CM

## 2015-07-27 ENCOUNTER — Other Ambulatory Visit: Payer: Self-pay | Admitting: Gastroenterology

## 2015-08-26 ENCOUNTER — Encounter (HOSPITAL_COMMUNITY): Payer: Self-pay | Admitting: *Deleted

## 2015-09-06 ENCOUNTER — Ambulatory Visit (HOSPITAL_COMMUNITY): Payer: Medicare Other | Admitting: Anesthesiology

## 2015-09-06 ENCOUNTER — Ambulatory Visit (HOSPITAL_COMMUNITY)
Admission: RE | Admit: 2015-09-06 | Discharge: 2015-09-06 | Disposition: A | Discharge status: Home / Self Care | Payer: Medicare Other | Source: Ambulatory Visit | Attending: Gastroenterology | Admitting: Gastroenterology

## 2015-09-06 ENCOUNTER — Encounter (HOSPITAL_COMMUNITY)
Admission: RE | Disposition: A | Discharge status: Home / Self Care | Payer: Self-pay | Source: Ambulatory Visit | Attending: Gastroenterology

## 2015-09-06 ENCOUNTER — Encounter (HOSPITAL_COMMUNITY): Payer: Self-pay

## 2015-09-06 DIAGNOSIS — Z1211 Encounter for screening for malignant neoplasm of colon: Secondary | ICD-10-CM | POA: Diagnosis present

## 2015-09-06 DIAGNOSIS — Z9049 Acquired absence of other specified parts of digestive tract: Secondary | ICD-10-CM | POA: Diagnosis not present

## 2015-09-06 DIAGNOSIS — I341 Nonrheumatic mitral (valve) prolapse: Secondary | ICD-10-CM | POA: Diagnosis not present

## 2015-09-06 DIAGNOSIS — D125 Benign neoplasm of sigmoid colon: Secondary | ICD-10-CM | POA: Insufficient documentation

## 2015-09-06 DIAGNOSIS — Z853 Personal history of malignant neoplasm of breast: Secondary | ICD-10-CM | POA: Insufficient documentation

## 2015-09-06 DIAGNOSIS — I10 Essential (primary) hypertension: Secondary | ICD-10-CM | POA: Insufficient documentation

## 2015-09-06 DIAGNOSIS — Z7982 Long term (current) use of aspirin: Secondary | ICD-10-CM | POA: Diagnosis not present

## 2015-09-06 DIAGNOSIS — H409 Unspecified glaucoma: Secondary | ICD-10-CM | POA: Diagnosis not present

## 2015-09-06 DIAGNOSIS — E039 Hypothyroidism, unspecified: Secondary | ICD-10-CM | POA: Insufficient documentation

## 2015-09-06 DIAGNOSIS — Z86010 Personal history of colonic polyps: Secondary | ICD-10-CM | POA: Insufficient documentation

## 2015-09-06 DIAGNOSIS — Z79899 Other long term (current) drug therapy: Secondary | ICD-10-CM | POA: Diagnosis not present

## 2015-09-06 HISTORY — PX: COLONOSCOPY WITH PROPOFOL: SHX5780

## 2015-09-06 HISTORY — DX: Hypothyroidism, unspecified: E03.9

## 2015-09-06 HISTORY — DX: Personal history of urinary calculi: Z87.442

## 2015-09-06 HISTORY — DX: Malignant (primary) neoplasm, unspecified: C80.1

## 2015-09-06 SURGERY — COLONOSCOPY WITH PROPOFOL
Anesthesia: Monitor Anesthesia Care

## 2015-09-06 MED ORDER — LACTATED RINGERS IV SOLN
INTRAVENOUS | Status: DC
  Administered 2015-09-06: 1000 mL via INTRAVENOUS

## 2015-09-06 MED ORDER — SODIUM CHLORIDE 0.9 % IV SOLN: INTRAVENOUS | Status: DC

## 2015-09-06 MED ORDER — PROPOFOL 10 MG/ML IV BOLUS
INTRAVENOUS | Status: AC
Start: 2015-09-06 — End: 2015-09-06
  Filled 2015-09-06: qty 20

## 2015-09-06 MED ORDER — PROPOFOL 10 MG/ML IV BOLUS
INTRAVENOUS | Status: DC | PRN
  Administered 2015-09-06 (×3): 20 mg via INTRAVENOUS
  Administered 2015-09-06: 40 mg via INTRAVENOUS
  Administered 2015-09-06: 10 mg via INTRAVENOUS
  Administered 2015-09-06: 40 mg via INTRAVENOUS

## 2015-09-06 MED ORDER — LACTATED RINGERS IV SOLN
INTRAVENOUS | Status: DC | PRN
  Administered 2015-09-06: 13:00:00 via INTRAVENOUS

## 2015-09-06 SURGICAL SUPPLY — 22 items

## 2015-09-06 NOTE — Transfer of Care (Signed)
Immediate Anesthesia Transfer of Care Note  Patient: Jenny [name]  Procedure(s) Performed: Procedure(s): COLONOSCOPY WITH PROPOFOL (N/A)  Patient Location: PACU and Endoscopy Unit  Anesthesia Type:MAC  Level of Consciousness: awake, oriented, patient cooperative, lethargic and responds to stimulation  Airway & Oxygen Therapy: Patient Spontanous Breathing and Patient connected to face mask oxygen  Post-op Assessment: Report given to RN, Post -op Vital signs reviewed and stable and Patient moving all extremities  Post vital signs: Reviewed and stable  Last Vitals:  Filed Vitals:   09/06/15 1228  BP: 143/90  Pulse: 68  Temp: 36.7 C  Resp: 25    Last Pain: There were no vitals filed for this visit.       Complications: No apparent anesthesia complications

## 2015-09-06 NOTE — Anesthesia Postprocedure Evaluation (Signed)
Anesthesia Post Note  Patient: Jenny [name]  Procedure(s) Performed: Procedure(s) (LRB): COLONOSCOPY WITH PROPOFOL (N/A)  Patient location during evaluation: PACU Anesthesia Type: MAC Level of consciousness: awake and alert Pain management: pain level controlled Vital Signs Assessment: post-procedure vital signs reviewed and stable Respiratory status: spontaneous breathing, nonlabored ventilation, respiratory function stable and patient connected to nasal cannula oxygen Cardiovascular status: stable and blood pressure returned to baseline Anesthetic complications: no    Last Vitals:  Filed Vitals:   09/06/15 1410 09/06/15 1420  BP: 142/73 146/79  Pulse: 62 63  Temp:    Resp: 27 24    Last Pain: There were no vitals filed for this visit.               Riccardo Dubin

## 2015-09-06 NOTE — Op Note (Signed)
[facility] Patient Name: Jenny [name] Procedure Date: 09/06/2015 MRN: [id number] Attending MD: Garlan Fair , MD Date of Birth: 1940-10-19 CSN: [id number] Age: 75 Admit Type: Outpatient Procedure:                Colonoscopy Indications:              High risk colon cancer surveillance: Personal                            history of adenoma less than 10 mm in size Providers:                Garlan Fair, MD, Zenon Mayo, RN, Cherylynn Ridges, Technician, Dion Saucier, CRNA Referring MD:              Medicines:                Propofol per Anesthesia Complications:            No immediate complications. Estimated Blood Loss:     Estimated blood loss: none. Procedure:                Pre-Anesthesia Assessment:                           - Prior to the procedure, a History and Physical                            was performed, and patient medications and                            allergies were reviewed. The patient's tolerance of                            previous anesthesia was also reviewed. The risks                            and benefits of the procedure and the sedation                            options and risks were discussed with the patient.                            All questions were answered, and informed consent                            was obtained. Prior Anticoagulants: The patient has                            taken aspirin, last dose was 1 day prior to                            procedure. ASA Grade Assessment: III - A patient  with severe systemic disease. After reviewing the                            risks and benefits, the patient was deemed in                            satisfactory condition to undergo the procedure.                           After obtaining informed consent, the colonoscope                            was passed under direct vision. Throughout the        procedure, the patient's blood pressure, pulse, and                            oxygen saturations were monitored continuously. The                            Colonoscope was introduced through the anus and                            advanced to the the cecum, identified by                            appendiceal orifice and ileocecal valve. The                            colonoscopy was performed without difficulty. The                            patient tolerated the procedure well. The quality                            of the bowel preparation was good. The terminal                            ileum, the ileocecal valve, the appendiceal orifice                            and the rectum were photographed. Scope In: 1:24:14 PM Scope Out: 1:42:14 PM Scope Withdrawal Time: 0 hours 12 minutes 51 seconds  Total Procedure Duration: 0 hours 18 minutes 0 seconds  Findings:      The perianal and digital rectal examinations were normal.      A 3 mm polyp was found in the distal sigmoid colon. The polyp was       sessile. The polyp was removed with a cold biopsy forceps. Resection and       retrieval were complete.      The exam was otherwise without abnormality. Impression:               - One 3 mm polyp in the distal sigmoid colon,  removed with a cold biopsy forceps. Resected and                            retrieved.                           - The examination was otherwise normal. Moderate Sedation:      N/A- Per Anesthesia Care Recommendation:           - Patient has a contact number available for                            emergencies. The signs and symptoms of potential                            delayed complications were discussed with the                            patient. Return to normal activities tomorrow.                            Written discharge instructions were provided to the                            patient.                           -  Repeat colonoscopy is not recommended for                            surveillance.                           - Resume previous diet.                           - Continue present medications. Procedure Code(s):        --- Professional ---                           [phone], Colonoscopy, flexible; with biopsy, single                            or multiple Diagnosis Code(s):        --- Professional ---                           Z86.010, Personal history of colonic polyps                           D12.5, Benign neoplasm of sigmoid colon CPT copyright 2016 [employer]. All rights reserved. The codes documented in this report are preliminary and upon coder review may  be revised to meet current compliance requirements. Earle Gell, MD Garlan Fair, MD 09/06/2015 1:49:26 PM This report has been signed electronically. Number of Addenda: 0

## 2015-09-06 NOTE — Anesthesia Preprocedure Evaluation (Signed)
Anesthesia Evaluation  Patient identified by MRN, date of birth, ID band Patient awake    Reviewed: Allergy & Precautions, H&P , Patient's Chart, lab work & pertinent test results, reviewed documented beta blocker date and time   Airway Mallampati: II  TM Distance: >3 FB Neck ROM: full    Dental no notable dental hx.    Pulmonary    Pulmonary exam normal breath sounds clear to auscultation       Cardiovascular hypertension,  Rhythm:regular Rate:Normal     Neuro/Psych    GI/Hepatic   Endo/Other    Renal/GU      Musculoskeletal   Abdominal   Peds  Hematology   Anesthesia Other Findings   Reproductive/Obstetrics                             Anesthesia Physical Anesthesia Plan  ASA: II  Anesthesia Plan: MAC   Post-op Pain Management:    Induction: Intravenous  Airway Management Planned: Mask and Natural Airway  Additional Equipment:   Intra-op Plan:   Post-operative Plan:   Informed Consent: I have reviewed the patients History and Physical, chart, labs and discussed the procedure including the risks, benefits and alternatives for the proposed anesthesia with the patient or authorized representative who has indicated his/her understanding and acceptance.   Dental Advisory Given  Plan Discussed with: CRNA and Surgeon  Anesthesia Plan Comments: (Discussed sedation and potential to need to place airway or ETT if warranted by clinical changes intra-operatively. We will start procedure as MAC.)        Anesthesia Quick Evaluation  

## 2015-09-06 NOTE — H&P (Signed)
  Procedure: Surveillance colonoscopy. 05/16/2010 normal surveillance colonoscopy was performed. In 2007 colonoscopy was performed with removal of a small adenomatous colon polyp  History: The patient is a 75 year old female born [birth year]. She is scheduled to undergo a surveillance colonoscopy today.  Past medical history: Hypertension. Hypothyroidism. Breast cancer diagnosed in 1998. Mitral valve prolapse syndrome. Hypercholesterolemia. Glaucoma. Kidney stones. Laparoscopic cholecystectomy. Abdominal hysterectomy. Breast lumpectomy. Lithotripsy.  Exam: The patient is alert and lying comfortably on the endoscopy stretcher. Abdomen is soft and nontender to palpation. Lungs are clear to auscultation. Cardiac exam reveals a regular rhythm.  Plan: Proceed with surveillance colonoscopy

## 2015-09-07 ENCOUNTER — Encounter (HOSPITAL_COMMUNITY): Payer: Self-pay | Admitting: Gastroenterology

## 2015-11-25 ENCOUNTER — Emergency Department (HOSPITAL_COMMUNITY)
Admission: EM | Admit: 2015-11-25 | Discharge: 2015-11-25 | Disposition: A | Discharge status: Home / Self Care | Payer: Medicare Other | Attending: Emergency Medicine | Admitting: Emergency Medicine

## 2015-11-25 ENCOUNTER — Emergency Department (HOSPITAL_COMMUNITY): Payer: Medicare Other

## 2015-11-25 DIAGNOSIS — R109 Unspecified abdominal pain: Secondary | ICD-10-CM | POA: Insufficient documentation

## 2015-11-25 DIAGNOSIS — Z791 Long term (current) use of non-steroidal anti-inflammatories (NSAID): Secondary | ICD-10-CM | POA: Insufficient documentation

## 2015-11-25 DIAGNOSIS — Z79899 Other long term (current) drug therapy: Secondary | ICD-10-CM | POA: Insufficient documentation

## 2015-11-25 DIAGNOSIS — R110 Nausea: Secondary | ICD-10-CM | POA: Diagnosis not present

## 2015-11-25 DIAGNOSIS — E039 Hypothyroidism, unspecified: Secondary | ICD-10-CM | POA: Insufficient documentation

## 2015-11-25 DIAGNOSIS — Z853 Personal history of malignant neoplasm of breast: Secondary | ICD-10-CM | POA: Insufficient documentation

## 2015-11-25 DIAGNOSIS — Z7982 Long term (current) use of aspirin: Secondary | ICD-10-CM | POA: Insufficient documentation

## 2015-11-25 DIAGNOSIS — I10 Essential (primary) hypertension: Secondary | ICD-10-CM | POA: Insufficient documentation

## 2015-11-25 LAB — URINALYSIS, ROUTINE W REFLEX MICROSCOPIC
Bilirubin Urine: NEGATIVE
GLUCOSE, UA: NEGATIVE mg/dL
HGB URINE DIPSTICK: NEGATIVE
KETONES UR: NEGATIVE mg/dL
Nitrite: NEGATIVE
PROTEIN: NEGATIVE mg/dL
Specific Gravity, Urine: 1.019 (ref 1.005–1.030)
pH: 6.5 (ref 5.0–8.0)

## 2015-11-25 LAB — URINE MICROSCOPIC-ADD ON: RBC / HPF: NONE SEEN RBC/hpf (ref 0–5)

## 2015-11-25 MED ORDER — OXYCODONE-ACETAMINOPHEN 5-325 MG PO TABS: 1.0000 | ORAL_TABLET | ORAL | 0 refills | Status: DC | PRN

## 2015-11-25 MED ORDER — PROMETHAZINE HCL 25 MG/ML IJ SOLN
12.5000 mg | Freq: Once | INTRAMUSCULAR | Status: AC
  Administered 2015-11-25: 12.5 mg via INTRAVENOUS
  Filled 2015-11-25: qty 1

## 2015-11-25 MED ORDER — FENTANYL CITRATE (PF) 100 MCG/2ML IJ SOLN
100.0000 ug | Freq: Once | INTRAMUSCULAR | Status: AC
  Administered 2015-11-25: 100 ug via INTRAVENOUS
  Filled 2015-11-25: qty 2

## 2015-11-25 MED ORDER — PROMETHAZINE HCL 25 MG PO TABS: 12.5000 mg | ORAL_TABLET | Freq: Four times a day (QID) | ORAL | 0 refills | Status: DC | PRN

## 2015-11-25 MED ORDER — ONDANSETRON HCL 4 MG/2ML IJ SOLN
4.0000 mg | Freq: Once | INTRAMUSCULAR | Status: AC
  Administered 2015-11-25: 4 mg via INTRAVENOUS
  Filled 2015-11-25: qty 2

## 2015-11-25 MED ORDER — FENTANYL CITRATE (PF) 100 MCG/2ML IJ SOLN
25.0000 ug | Freq: Once | INTRAMUSCULAR | Status: AC
  Administered 2015-11-25: 25 ug via INTRAVENOUS
  Filled 2015-11-25: qty 2

## 2015-11-25 NOTE — ED Provider Notes (Signed)
[facility] DEPT Provider Note: Georgena Spurling, MD, FACEP  CSN: [id number] MRN: [id number] ARRIVAL: 11/25/15 at [place]  By signing my name below, I, Georgette Shell, attest that this documentation has been prepared under the direction and in the presence of Shanon Rosser, MD. Electronically Signed: Georgette Shell, ED Scribe. 11/25/15. 3:02 AM.  CHIEF COMPLAINT  Flank Pain   HISTORY OF PRESENT ILLNESS  HPI Comments: Jenny [name] is a 75 y.o. female with h/o kidney stones who presents to the Emergency Department complaining of gradually worsening, intermittent, right flank pain onset 4 days ago. It acutely worsened this morning and she rates it a 7 out of 10. Pt also has associated nausea. Pt has taken Advil with mild, temporary relief of the pain. She denies hematuria, difficulty urinating, fever and chills.  Urologist: [facility]   Past Medical History:  Diagnosis Date  . Cancer ([place]) 18 YRS    LEFT BREAST  . History of kidney stones LAST FEB 2017  . Hypertension   . Hypothyroidism     Past Surgical History:  Procedure Laterality Date  . ABDOMINAL HYSTERECTOMY     PARTIAL  . CHOLECYSTECTOMY    . COLONOSCOPY WITH PROPOFOL N/A 09/06/2015   Procedure: COLONOSCOPY WITH PROPOFOL;  Surgeon: Garlan Fair, MD;  Location: [facility] ENDOSCOPY;  Service: Endoscopy;  Laterality: N/A;  . LEFT BREAST LUMPECTOMY  18 YRS AGO   RADIATION DONE  . STENT PLACE LEFT URETER ([place] HX)  9 YRS AGO    No family history on file.  Social History  Substance Use Topics  . Smoking status: Never Smoker  . Smokeless tobacco: Never Used  . Alcohol use No    Prior to Admission medications   Medication Sig Start Date End Date Taking? Authorizing Provider  amLODipine (NORVASC) 5 MG tablet Take 5 mg by mouth every morning.  05/02/15  Yes Historical Provider, MD  aspirin 81 MG tablet Take 81 mg by mouth every morning.    Yes Historical Provider, MD  dorzolamide-timolol (COSOPT) 22.3-6.8 MG/ML ophthalmic solution Place  1 drop into both eyes 2 (two) times daily. 05/02/15  Yes Historical Provider, MD  ibuprofen (ADVIL,MOTRIN) 200 MG tablet Take 200 mg by mouth every 6 (six) hours as needed for headache, mild pain or moderate pain.   Yes Historical Provider, MD  latanoprost (XALATAN) 0.005 % ophthalmic solution Place 1 drop into both eyes at bedtime. 05/03/15  Yes Historical Provider, MD  lisinopril (PRINIVIL,ZESTRIL) 20 MG tablet Take 20 mg by mouth every morning.  04/12/15  Yes Historical Provider, MD  SYNTHROID 100 MCG tablet Take 100 mcg by mouth daily before breakfast.  05/02/15  Yes Historical Provider, MD  oxyCODONE-acetaminophen (PERCOCET) 5-325 MG tablet Take 1 tablet by mouth every 4 (four) hours as needed (for pain; may cause constipation). 11/25/15   Jeovanny Cuadros, MD  promethazine (PHENERGAN) 25 MG tablet Take 0.5-1 tablets (12.5-25 mg total) by mouth every 6 (six) hours as needed for nausea or vomiting. 11/25/15   Shanon Rosser, MD    Allergies Review of patient's allergies indicates no known allergies.   REVIEW OF SYSTEMS  Negative except as noted here or in the History of Present Illness.   PHYSICAL EXAMINATION  Initial Vital Signs Blood pressure 146/79, pulse 71, temperature 98.7 F (37.1 C), temperature source Oral, resp. rate 18, SpO2 99 %.  Examination General: Well-developed, well-nourished female in no acute distress; appearance consistent with age of record HENT: normocephalic; atraumatic Eyes: pupils equal, round and reactive  to light; extraocular muscles intact Neck: supple Heart: regular rate and rhythm; no murmurs, rubs or gallops Lungs: clear to auscultation bilaterally Abdomen: soft; nondistended; nontender; no masses or hepatosplenomegaly; bowel sounds present GU: No CVA tenderness Extremities: No deformity; full range of motion; pulses normal Neurologic: Awake, alert and oriented; motor function intact in all extremities and symmetric; no facial droop Skin: Warm and  dry Psychiatric: Normal mood and affect   RESULTS  Summary of this visit's results, reviewed by myself:   EKG Interpretation  Date/Time:    Ventricular Rate:    PR Interval:    QRS Duration:   QT Interval:    QTC Calculation:   R Axis:     Text Interpretation:        Laboratory Studies: Results for orders placed or performed during the hospital encounter of 11/25/15 (from the past 24 hour(s))  Urinalysis, Routine w reflex microscopic- may I&O cath if menses     Status: Abnormal   Collection Time: 11/25/15  5:38 AM  Result Value Ref Range   Color, Urine YELLOW YELLOW   APPearance CLEAR CLEAR   Specific Gravity, Urine 1.019 1.005 - 1.030   pH 6.5 5.0 - 8.0   Glucose, UA NEGATIVE NEGATIVE mg/dL   Hgb urine dipstick NEGATIVE NEGATIVE   Bilirubin Urine NEGATIVE NEGATIVE   Ketones, ur NEGATIVE NEGATIVE mg/dL   Protein, ur NEGATIVE NEGATIVE mg/dL   Nitrite NEGATIVE NEGATIVE   Leukocytes, UA SMALL (A) NEGATIVE  Urine microscopic-add on     Status: Abnormal   Collection Time: 11/25/15  5:38 AM  Result Value Ref Range   Squamous Epithelial / LPF 0-5 (A) NONE SEEN   WBC, UA 0-5 0 - 5 WBC/hpf   RBC / HPF NONE SEEN 0 - 5 RBC/hpf   Bacteria, UA MANY (A) NONE SEEN   Crystals CA OXALATE CRYSTALS (A) NEGATIVE   Imaging Studies: Ct Renal Stone Study  Result Date: 11/25/2015 CLINICAL DATA:  Right flank pain for 3 days, more intense tonight. EXAM: CT ABDOMEN AND PELVIS WITHOUT CONTRAST TECHNIQUE: Multidetector CT imaging of the abdomen and pelvis was performed following the standard protocol without IV contrast. COMPARISON:  05/07/2015 FINDINGS: Lower chest:   No significant abnormality Hepatobiliary: Cholecystectomy. Liver and bile ducts are normal. Unchanged hypodensity at the falciform ligament, benign. Pancreas: Normal Spleen: Normal Adrenals/Urinary Tract: No urinary calculi. No significant renal parenchymal lesions. Ureters and urinary bladder are unremarkable. Stomach/Bowel:  Stomach, small bowel and colon are unremarkable. Vascular/Lymphatic: The abdominal aorta is normal in caliber. There is mild atherosclerotic calcification. There is no adenopathy in the abdomen or pelvis. Reproductive: Hysterectomy.  No adnexal abnormality. Other: No acute inflammatory changes are evident in the abdomen or pelvis. No ascites. Musculoskeletal: Fat containing umbilical hernia. No significant skeletal lesions. Severe lumbar curvature and degenerative changes. IMPRESSION: 1. No acute findings are evident in the abdomen or pelvis. 2. Fat containing umbilical hernia 3. Severe lumbar curvature and degenerative change. Electronically Signed   By: Andreas Newport M.D.   On: 11/25/2015 03:41    ED COURSE  Nursing notes and initial vitals signs, including pulse oximetry, reviewed.  6:12 AM Pain down to about a 2 out of 10. Nausea improved after Zofran and Phenergan. Patient advised of lab and CT findings. Although history was suspicious for ureterolithiasis none was seen on CT. This may represent pain from a spinal etiology.  PROCEDURES    ED DIAGNOSES     ICD-9-CM ICD-10-CM   1. Left flank pain  789.09 R10.9      I personally performed the services described in this documentation, which was scribed in my presence. The recorded information has been reviewed and is accurate.      Shanon Rosser, MD 11/25/15 [phone]

## 2015-11-25 NOTE — ED Triage Notes (Signed)
Pt states that she has a hx of kidney stones and has been having R flank pain since Tuesday but tonight it became more intense. Alert and oriented.

## 2015-11-25 NOTE — ED Notes (Signed)
MD at bedside. 

## 2016-04-09 ENCOUNTER — Other Ambulatory Visit: Payer: Self-pay | Admitting: Internal Medicine

## 2016-04-09 DIAGNOSIS — Z1231 Encounter for screening mammogram for malignant neoplasm of breast: Secondary | ICD-10-CM

## 2016-05-21 ENCOUNTER — Ambulatory Visit
Admission: RE | Admit: 2016-05-21 | Discharge: 2016-05-21 | Disposition: A | Discharge status: Home / Self Care | Payer: Medicare Other | Source: Ambulatory Visit | Attending: Internal Medicine | Admitting: Internal Medicine

## 2016-05-21 DIAGNOSIS — Z1231 Encounter for screening mammogram for malignant neoplasm of breast: Secondary | ICD-10-CM

## 2017-04-12 ENCOUNTER — Other Ambulatory Visit: Payer: Self-pay | Admitting: Internal Medicine

## 2017-04-12 DIAGNOSIS — Z1231 Encounter for screening mammogram for malignant neoplasm of breast: Secondary | ICD-10-CM

## 2017-05-24 ENCOUNTER — Ambulatory Visit
Admission: RE | Admit: 2017-05-24 | Discharge: 2017-05-24 | Disposition: A | Discharge status: Home / Self Care | Payer: Medicare Other | Source: Ambulatory Visit | Attending: Internal Medicine | Admitting: Internal Medicine

## 2017-05-24 DIAGNOSIS — Z1231 Encounter for screening mammogram for malignant neoplasm of breast: Secondary | ICD-10-CM

## 2017-05-24 HISTORY — DX: Personal history of irradiation: Z92.3

## 2017-05-24 HISTORY — DX: Malignant neoplasm of unspecified site of unspecified female breast: C50.919

## 2018-04-15 ENCOUNTER — Other Ambulatory Visit: Payer: Self-pay | Admitting: Internal Medicine

## 2018-04-15 DIAGNOSIS — Z1231 Encounter for screening mammogram for malignant neoplasm of breast: Secondary | ICD-10-CM

## 2018-06-16 ENCOUNTER — Ambulatory Visit: Payer: Medicare Other

## 2018-08-01 ENCOUNTER — Ambulatory Visit
Admission: RE | Admit: 2018-08-01 | Discharge: 2018-08-01 | Disposition: A | Discharge status: Home / Self Care | Payer: Medicare Other | Source: Ambulatory Visit | Attending: Internal Medicine | Admitting: Internal Medicine

## 2018-08-01 ENCOUNTER — Other Ambulatory Visit: Payer: Self-pay

## 2018-08-01 ENCOUNTER — Ambulatory Visit: Payer: Medicare Other

## 2018-08-01 DIAGNOSIS — Z1231 Encounter for screening mammogram for malignant neoplasm of breast: Secondary | ICD-10-CM

## 2019-06-22 ENCOUNTER — Other Ambulatory Visit: Payer: Self-pay | Admitting: Internal Medicine

## 2019-06-22 DIAGNOSIS — Z1231 Encounter for screening mammogram for malignant neoplasm of breast: Secondary | ICD-10-CM

## 2019-08-03 ENCOUNTER — Ambulatory Visit
Admission: RE | Admit: 2019-08-03 | Discharge: 2019-08-03 | Disposition: A | Discharge status: Home / Self Care | Payer: Medicare Other | Source: Ambulatory Visit | Attending: Internal Medicine | Admitting: Internal Medicine

## 2019-08-03 ENCOUNTER — Other Ambulatory Visit: Payer: Self-pay | Admitting: Internal Medicine

## 2019-08-03 ENCOUNTER — Other Ambulatory Visit: Payer: Self-pay

## 2019-08-03 ENCOUNTER — Inpatient Hospital Stay: Admission: RE | Admit: 2019-08-03 | Payer: Medicare Other | Source: Ambulatory Visit

## 2019-08-03 DIAGNOSIS — Z1231 Encounter for screening mammogram for malignant neoplasm of breast: Secondary | ICD-10-CM

## 2019-09-29 ENCOUNTER — Other Ambulatory Visit: Payer: Self-pay | Admitting: Internal Medicine

## 2019-09-29 DIAGNOSIS — M5441 Lumbago with sciatica, right side: Secondary | ICD-10-CM

## 2019-10-22 ENCOUNTER — Other Ambulatory Visit: Payer: Self-pay

## 2019-10-22 ENCOUNTER — Ambulatory Visit
Admission: RE | Admit: 2019-10-22 | Discharge: 2019-10-22 | Disposition: A | Discharge status: Home / Self Care | Payer: Medicare Other | Source: Ambulatory Visit | Attending: Internal Medicine | Admitting: Internal Medicine

## 2019-10-22 DIAGNOSIS — M5441 Lumbago with sciatica, right side: Secondary | ICD-10-CM

## 2020-03-24 DIAGNOSIS — H43811 Vitreous degeneration, right eye: Secondary | ICD-10-CM | POA: Diagnosis not present

## 2020-03-24 DIAGNOSIS — H2513 Age-related nuclear cataract, bilateral: Secondary | ICD-10-CM | POA: Diagnosis not present

## 2020-03-24 DIAGNOSIS — H353131 Nonexudative age-related macular degeneration, bilateral, early dry stage: Secondary | ICD-10-CM | POA: Diagnosis not present

## 2020-03-24 DIAGNOSIS — H35371 Puckering of macula, right eye: Secondary | ICD-10-CM | POA: Diagnosis not present

## 2020-03-24 DIAGNOSIS — H401132 Primary open-angle glaucoma, bilateral, moderate stage: Secondary | ICD-10-CM | POA: Diagnosis not present

## 2020-07-11 DIAGNOSIS — H401131 Primary open-angle glaucoma, bilateral, mild stage: Secondary | ICD-10-CM | POA: Diagnosis not present

## 2020-08-01 DIAGNOSIS — R8271 Bacteriuria: Secondary | ICD-10-CM | POA: Diagnosis not present

## 2020-08-01 DIAGNOSIS — R3914 Feeling of incomplete bladder emptying: Secondary | ICD-10-CM | POA: Diagnosis not present

## 2020-08-16 ENCOUNTER — Ambulatory Visit
Admission: RE | Admit: 2020-08-16 | Discharge: 2020-08-16 | Disposition: A | Discharge status: Home / Self Care | Payer: Medicare Other | Source: Ambulatory Visit | Attending: Internal Medicine | Admitting: Internal Medicine

## 2020-08-16 ENCOUNTER — Other Ambulatory Visit: Payer: Self-pay

## 2020-08-16 DIAGNOSIS — Z1231 Encounter for screening mammogram for malignant neoplasm of breast: Secondary | ICD-10-CM | POA: Diagnosis not present

## 2020-08-24 DIAGNOSIS — N8111 Cystocele, midline: Secondary | ICD-10-CM | POA: Diagnosis not present

## 2020-10-11 DIAGNOSIS — R3914 Feeling of incomplete bladder emptying: Secondary | ICD-10-CM | POA: Diagnosis not present

## 2020-10-14 DIAGNOSIS — N8111 Cystocele, midline: Secondary | ICD-10-CM | POA: Diagnosis not present

## 2020-10-14 DIAGNOSIS — R350 Frequency of micturition: Secondary | ICD-10-CM | POA: Diagnosis not present

## 2020-10-18 ENCOUNTER — Other Ambulatory Visit: Payer: Self-pay | Admitting: Urology

## 2020-10-27 DIAGNOSIS — M47816 Spondylosis without myelopathy or radiculopathy, lumbar region: Secondary | ICD-10-CM | POA: Diagnosis not present

## 2020-10-27 DIAGNOSIS — I10 Essential (primary) hypertension: Secondary | ICD-10-CM | POA: Diagnosis not present

## 2020-10-27 DIAGNOSIS — Z0000 Encounter for general adult medical examination without abnormal findings: Secondary | ICD-10-CM | POA: Diagnosis not present

## 2020-10-27 DIAGNOSIS — Z86010 Personal history of colonic polyps: Secondary | ICD-10-CM | POA: Diagnosis not present

## 2020-10-27 DIAGNOSIS — E039 Hypothyroidism, unspecified: Secondary | ICD-10-CM | POA: Diagnosis not present

## 2020-10-27 DIAGNOSIS — Z1389 Encounter for screening for other disorder: Secondary | ICD-10-CM | POA: Diagnosis not present

## 2020-10-28 NOTE — Progress Notes (Signed)
DUE TO COVID-19 ONLY ONE VISITOR IS ALLOWED TO COME WITH YOU AND STAY IN THE WAITING ROOM ONLY DURING PRE OP AND PROCEDURE DAY OF SURGERY. THE 1 VISITOR  MAY VISIT WITH YOU AFTER SURGERY IN YOUR PRIVATE ROOM DURING VISITING HOURS ONLY!  YOU NEED TO HAVE A COVID 19 TEST ON_9/04/2020 ______ '@_______'$ , THIS TEST MUST BE DONE BEFORE SURGERY, See map.                Jenny [name]  10/28/2020   Your procedure is scheduled on:  11/15/2020   Report to [facility] Main  Entrance   Report to admitting at   [place] AM     Call this number if you have problems the morning of surgery [phone]    Remember: Do not eat food , candy gum or mints :After Midnight. You may have clear liquids from midnight until  0415am    CLEAR LIQUID DIET   Foods Allowed                                                                       Coffee and tea, regular and decaf                              Plain Jell-O any favor except red or purple                                            Fruit ices (not with fruit pulp)                                      Iced Popsicles                                     Carbonated beverages, regular and diet                                    Cranberry, grape and apple juices Sports drinks like Gatorade Lightly seasoned clear broth or consume(fat free) Sugar, honey syrup   _____________________________________________________________________    BRUSH YOUR TEETH MORNING OF SURGERY AND RINSE YOUR MOUTH OUT, NO CHEWING GUM CANDY OR MINTS.     Take these medicines the morning of surgery with A SIP OF WATER:  amlodipine, eye drops as usual, synthroid   DO NOT TAKE ANY DIABETIC MEDICATIONS DAY OF YOUR SURGERY                               You may not have any metal on your body including hair pins and              piercings  Do not wear jewelry, make-up, lotions, powders or perfumes, deodorant  Do not wear nail polish on your fingernails.  Do not shave   48 hours prior to surgery.              Men may shave face and neck.   Do not bring valuables to the hospital. [place].  Contacts, dentures or bridgework may not be worn into surgery.  Leave suitcase in the car. After surgery it may be brought to your room.     Patients discharged the day of surgery will not be allowed to drive home. IF YOU ARE HAVING SURGERY AND GOING HOME THE SAME DAY, YOU MUST HAVE AN ADULT TO DRIVE YOU HOME AND BE WITH YOU FOR 24 HOURS. YOU MAY GO HOME BY TAXI OR UBER OR ORTHERWISE, BUT AN ADULT MUST ACCOMPANY YOU HOME AND STAY WITH YOU FOR 24 HOURS.  Name and phone number of your driver:  Special Instructions: N/A              Please read over the following fact sheets you were given: _____________________________________________________________________  [facility] - Preparing for Surgery Before surgery, you can play an important role.  Because skin is not sterile, your skin needs to be as free of germs as possible.  You can reduce the number of germs on your skin by washing with CHG (chlorahexidine gluconate) soap before surgery.  CHG is an antiseptic cleaner which kills germs and bonds with the skin to continue killing germs even after washing. Please DO NOT use if you have an allergy to CHG or antibacterial soaps.  If your skin becomes reddened/irritated stop using the CHG and inform your nurse when you arrive at Short Stay. Do not shave (including legs and underarms) for at least 48 hours prior to the first CHG shower.  You may shave your face/neck. Please follow these instructions carefully:  1.  Shower with CHG Soap the night before surgery and the  morning of Surgery.  2.  If you choose to wash your hair, wash your hair first as usual with your  normal  shampoo.  3.  After you shampoo, rinse your hair and body thoroughly to remove the  shampoo.                           4.  Use CHG as you would any other liquid  soap.  You can apply chg directly  to the skin and wash                       Gently with a scrungie or clean washcloth.  5.  Apply the CHG Soap to your body ONLY FROM THE NECK DOWN.   Do not use on face/ open                           Wound or open sores. Avoid contact with eyes, ears mouth and genitals (private parts).                       Wash face,  Genitals (private parts) with your normal soap.             6.  Wash thoroughly, paying special attention to the area where your surgery  will be performed.  7.  Thoroughly rinse your body  with warm water from the neck down.  8.  DO NOT shower/wash with your normal soap after using and rinsing off  the CHG Soap.                9.  Pat yourself dry with a clean towel.            10.  Wear clean pajamas.            11.  Place clean sheets on your bed the night of your first shower and do not  sleep with pets. Day of Surgery : Do not apply any lotions/deodorants the morning of surgery.  Please wear clean clothes to the hospital/surgery center.  FAILURE TO FOLLOW THESE INSTRUCTIONS MAY RESULT IN THE CANCELLATION OF YOUR SURGERY PATIENT SIGNATURE_________________________________  NURSE SIGNATURE__________________________________  ________________________________________________________________________

## 2020-11-01 ENCOUNTER — Other Ambulatory Visit: Payer: Self-pay

## 2020-11-01 ENCOUNTER — Encounter (HOSPITAL_COMMUNITY): Payer: Self-pay

## 2020-11-01 ENCOUNTER — Encounter (HOSPITAL_COMMUNITY)
Admission: RE | Admit: 2020-11-01 | Discharge: 2020-11-01 | Disposition: A | Discharge status: Home / Self Care | Payer: Medicare Other | Source: Ambulatory Visit | Attending: Urology | Admitting: Urology

## 2020-11-01 DIAGNOSIS — Z01818 Encounter for other preprocedural examination: Secondary | ICD-10-CM | POA: Insufficient documentation

## 2020-11-01 LAB — PROTIME-INR
INR: 0.9 (ref 0.8–1.2)
Prothrombin Time: 12.6 seconds (ref 11.4–15.2)

## 2020-11-01 LAB — BASIC METABOLIC PANEL
Anion gap: 5 (ref 5–15)
BUN: 20 mg/dL (ref 8–23)
CO2: 26 mmol/L (ref 22–32)
Calcium: 9.1 mg/dL (ref 8.9–10.3)
Chloride: 105 mmol/L (ref 98–111)
Creatinine, Ser: 0.63 mg/dL (ref 0.44–1.00)
GFR, Estimated: >60 mL/min (ref >60)
Glucose, Bld: 82 mg/dL (ref 70–99)
Potassium: 4.3 mmol/L (ref 3.5–5.1)
Sodium: 136 mmol/L (ref 135–145)

## 2020-11-01 LAB — CBC
HCT: 44.3 % (ref 36.0–46.0)
Hemoglobin: 15.2 g/dL — ABNORMAL HIGH (ref 12.0–15.0)
MCH: 32.2 pg (ref 26.0–34.0)
MCHC: 34.3 g/dL (ref 30.0–36.0)
MCV: 93.9 fL (ref 80.0–100.0)
Platelets: 180 10*3/uL (ref 150–400)
RBC: 4.72 MIL/uL (ref 3.87–5.11)
RDW: 12.3 % (ref 11.5–15.5)
WBC: 8 10*3/uL (ref 4.0–10.5)
nRBC: 0 % (ref 0.0–0.2)

## 2020-11-01 NOTE — Progress Notes (Addendum)
Anesthesia Review:  PCP: DR Lavone Orn  pt with recent physical .  Called and requested note.  Requested .  They are to fax. LOV 10/27/2020 on chart.  Cardiologist : none  Chest x-ray : EKG : 11/01/20  Echo : Stress test: Cardiac Cath :  Activity level: can do a flight of stairs without difficulty  Sleep Study/ CPAP : none  Fasting Blood Sugar :      / Checks Blood Sugar -- times a day:   Blood Thinner/ Instructions /Last Dose: ASA / Instructions/ Last Dose :   Covid test-11/11/20  81 mg Aspirin

## 2020-11-11 ENCOUNTER — Other Ambulatory Visit: Payer: Self-pay | Admitting: Urology

## 2020-11-11 DIAGNOSIS — N8111 Cystocele, midline: Secondary | ICD-10-CM | POA: Diagnosis not present

## 2020-11-11 LAB — SARS CORONAVIRUS 2 (TAT 6-24 HRS): SARS Coronavirus 2: NEGATIVE

## 2020-11-14 MED ORDER — GENTAMICIN SULFATE 40 MG/ML IJ SOLN
5.0000 mg/kg | INTRAVENOUS | Status: AC
  Administered 2020-11-15: 340 mg via INTRAVENOUS
  Filled 2020-11-14: qty 8.5

## 2020-11-14 NOTE — H&P (Signed)
I was consulted to assess the patient's for prolapse. She feels bulging the vagina. Sometimes she tries reduced with wiping. She has had a hysterectomy. She is continent.   She voids every 3 hours gets up twice a night. Flow is reasonable feels empty   She has had 2 lithotripsy for kidney stones. She has had a hysterectomy   On pelvic examination patient had a large grade 2 or small grade 3 cystocele with central defect. She could not bear down that much but her vaginal cuff descended from 8 or 9 cm to a probably 6 cm. She had little bit of weakness posteriorly at the apex but good support otherwise. Again she cannot cough hard. No incontinence.   Picture drawn. Role of urodynamics pessary and watchful waiting discussed. If the patient had surgery I would consented for transvaginal vault suspension cystocele repair graft and she may or may not need a vault suspension. She does not want a pessary. She wants to think about it but in the short term it Lea she chose watchful waiting. She change her mind she will call me and I will order the urodynamics.   Today  Frequency stable. Prolapse stable.  On urodynamics patient did not voidWas catheterized for 50 mL. Maximum bladder capacity 300 mL. She had increased bladder sensation. Bladder was stable. No stress incontinence with Valsalva pressure of 82 cm water. During voiding she voided 220 mL with a maximum flow of 6 mL/second. Maximum voiding pressure 28 cm water. Residual 83 mL. Voiding pattern was intermittent and prolonged. EMG activity decreased during the voiding phase. The details of the urodynamics or sign dictated   picture drawn. I went over transvaginal vault suspension cystocele repair and graft. Watchful waiting pessary discussed. Natural history discussed. To Novo incontinence with severity and sequelae discussed.   Patient and her husband would like to proceed with surgery. No pessary period is sexually active.      ALLERGIES: Percocet  TABS     MEDICATIONS: Aspirin 81 mg tablet,chewable  Amlodipine Besylate 5 mg tablet Oral  Dorzolamide Hcl 2 % drops Ophthalmic  Latanoprost 0.005 % drops Ophthalmic  Levothyroxine 100 mcg capsule  Lisinopril 30 mg tablet Oral  Multivitamin  Synthroid 137 mcg tablet Oral      GU PSH: Complex cystometrogram, w/ void pressure and urethral pressure profile studies, any technique - 10/11/2020 Complex Uroflow - 10/11/2020 Emg surf Electrd - 10/11/2020 ESWL - 2017 Hysterectomy Unilat SO - 2008 Intrabd voidng Press - 10/11/2020        PSH Notes: Renal Lithotripsy, Appendectomy, Gallbladder Surgery, Breast Surgery Lumpectomy, Hysterectomy    NON-GU PSH: Appendectomy - 2008 Remove Breast Lesion - 2008           GU PMH: Incomplete bladder emptying - 10/11/2020, Her PVR is 170m. , - 08/01/2020 Cystocele, midline - 08/24/2020 Female genital prolapse, unspecified - 08/24/2020, She has proximal anterior prolapse with grade 2 cystocele and a possible enterocele. I will have her see Dr. MMatilde Sprang , - 08/01/2020 Postmenopausal atrophic vaginitis, She has severe atrophy. She has a remote history of bladder cancer. I will defer estrogen therapy decision to Dr. MMatilde Sprang - 08/01/2020 Ureteral calculus - 2017, Calculus of left ureter, - 2017, Ureteral Stone, - 2014 Renal calculus, Nephrolithiasis - 2017 History of urolithiasis, Nephrolithiasis - 2014       PMH Notes:  2008-10-22 09:19:10 - Note: Nephrolithiasis Of The Left Kidney  2006-11-07 15:13:22 - Note: Breast Cancer    NON-GU PMH: Breast Cancer, History,  History of breast cancer - 2017 Encounter for general adult medical examination without abnormal findings, Encounter for preventive health examination - 2017 Personal history of other diseases of the circulatory system, History of hypertension - 2017 Personal history of other diseases of the nervous system and sense organs, History of glaucoma - 2017, History of glaucoma, - 2014 Cardiac murmur,  unspecified, Murmurs - 2014 Personal history of other endocrine, nutritional and metabolic disease, History of hypothyroidism - 2014, History of diabetes mellitus, - 2014 Glaucoma Hypertension     FAMILY HISTORY: cardiac disorder - Runs In Family Death In The Family Father - Runs In Family Death In The Family Mother - Runs In Family    SOCIAL HISTORY: Marital Status: Married Preferred Language: English; Ethnicity: Not Hispanic Or Latino; Race: White Current Smoking Status: Patient has never smoked.  Does not use smokeless tobacco. Has never drank.  Does not use drugs. Does not drink caffeine. Patient's occupation is/was Retired from [employer].      Notes: Married, Number of children, Retired, Marital History - Currently Married, Tobacco Use, Alcohol Use, Caffeine Use    REVIEW OF SYSTEMS:      GU Review Female:  Patient denies frequent urination, hard to postpone urination, burning /pain with urination, get up at night to urinate, leakage of urine, stream starts and stops, trouble starting your stream, have to strain to urinate, and being pregnant.     Gastrointestinal (Upper):  Patient denies nausea, vomiting, and indigestion/ heartburn.     Gastrointestinal (Lower):  Patient denies diarrhea and constipation.     Constitutional:  Patient denies fever, night sweats, weight loss, and fatigue.     Skin:  Patient denies skin rash/ lesion and itching.     Eyes:  Patient denies blurred vision and double vision.     Ears/ Nose/ Throat:  Patient denies sore throat and sinus problems.     Hematologic/Lymphatic:  Patient denies swollen glands and easy bruising.     Cardiovascular:  Patient denies leg swelling and chest pains.     Respiratory:  Patient denies cough and shortness of breath.     Endocrine:  Patient denies excessive thirst.     Musculoskeletal:  Patient reports back pain. Patient denies joint pain.     Neurological:  Patient denies headaches and dizziness.     Psychologic:  Patient  denies anxiety and depression.     VITAL SIGNS: None      PAST DATA REVIEW: None      PROCEDURES: None       ASSESSMENT:          ICD-10 Details        1 GU:  Cystocele, midline - N81.11         2  Urinary Frequency - R35.0             Notes:  I drew her a picture and we talked about prolapse surgery in detail. Pros, cons, general surgical and anesthetic risks, and other options including behavioral therapy, pessaries, and watchful waiting were discussed. She understands that prolapse repairs are successful in 80-85% of cases for prolapse symptoms and can recur anteriorly, posteriorly, and/or apically. She understands that in most cases I use a graft and general risks were discussed. Surgical risks were described but not limited to the discussion of injury to neighboring structures including the bowel (with possible life-threatening sepsis and colostomy), bladder, urethra, vagina (all resulting in further surgery), and ureter (resulting in re-implantation). We talked about  injury to nerves/soft tissue leading to debilitating and intractable pelvic, abdominal, and lower extremity pain syndromes and neuropathies. The risks of buttock pain, intractable dyspareunia, and vaginal narrowing and shortening with sequelae were discussed. Bleeding risks, transfusion rates, and infection were discussed. The risk of persistent, de novo, or worsening bladder and/or bowel incontinence/dysfunction was discussed. The need for CIC was described as well the usual post-operative course. The patient understands that she might not reach her treatment goal and that she might be worse following surgery.    PLAN:   Schedule  Return Visit/Planned Activity: Return PRN - Office Visit  Document  Letter(s):  Created for Patient: Clinical Summary

## 2020-11-15 ENCOUNTER — Observation Stay (HOSPITAL_COMMUNITY)
Admission: RE | Admit: 2020-11-15 | Discharge: 2020-11-16 | Disposition: A | Discharge status: Home / Self Care | Payer: Medicare Other | Attending: Urology | Admitting: Urology

## 2020-11-15 ENCOUNTER — Encounter (HOSPITAL_COMMUNITY): Payer: Self-pay | Admitting: Urology

## 2020-11-15 ENCOUNTER — Other Ambulatory Visit: Payer: Self-pay

## 2020-11-15 ENCOUNTER — Ambulatory Visit (HOSPITAL_COMMUNITY): Payer: Medicare Other | Admitting: Anesthesiology

## 2020-11-15 ENCOUNTER — Encounter (HOSPITAL_COMMUNITY)
Admission: RE | Disposition: A | Discharge status: Home / Self Care | Payer: Self-pay | Source: Home / Self Care | Attending: Urology

## 2020-11-15 ENCOUNTER — Ambulatory Visit (HOSPITAL_COMMUNITY): Payer: Medicare Other | Admitting: Physician Assistant

## 2020-11-15 DIAGNOSIS — Z79899 Other long term (current) drug therapy: Secondary | ICD-10-CM | POA: Diagnosis not present

## 2020-11-15 DIAGNOSIS — E119 Type 2 diabetes mellitus without complications: Secondary | ICD-10-CM | POA: Insufficient documentation

## 2020-11-15 DIAGNOSIS — N819 Female genital prolapse, unspecified: Secondary | ICD-10-CM | POA: Diagnosis present

## 2020-11-15 DIAGNOSIS — E039 Hypothyroidism, unspecified: Secondary | ICD-10-CM | POA: Insufficient documentation

## 2020-11-15 DIAGNOSIS — I10 Essential (primary) hypertension: Secondary | ICD-10-CM | POA: Insufficient documentation

## 2020-11-15 DIAGNOSIS — N8111 Cystocele, midline: Secondary | ICD-10-CM | POA: Diagnosis not present

## 2020-11-15 DIAGNOSIS — Z853 Personal history of malignant neoplasm of breast: Secondary | ICD-10-CM | POA: Diagnosis not present

## 2020-11-15 HISTORY — PX: CYSTOCELE REPAIR: SHX163

## 2020-11-15 LAB — TYPE AND SCREEN
ABO/RH(D): A POS
Antibody Screen: NEGATIVE

## 2020-11-15 LAB — ABO/RH: ABO/RH(D): A POS

## 2020-11-15 SURGERY — COLPORRHAPHY, ANTERIOR, FOR CYSTOCELE REPAIR
Anesthesia: General | Site: Vagina

## 2020-11-15 MED ORDER — EPHEDRINE 5 MG/ML INJ
INTRAVENOUS | Status: AC
  Filled 2020-11-15: qty 5

## 2020-11-15 MED ORDER — DEXAMETHASONE SODIUM PHOSPHATE 4 MG/ML IJ SOLN
INTRAMUSCULAR | Status: DC | PRN
  Administered 2020-11-15: 5 mg via INTRAVENOUS

## 2020-11-15 MED ORDER — ONDANSETRON HCL 4 MG/2ML IJ SOLN: 4.0000 mg | Freq: Once | INTRAMUSCULAR | Status: DC | PRN

## 2020-11-15 MED ORDER — LIP MEDEX EX OINT
TOPICAL_OINTMENT | CUTANEOUS | Status: AC
  Filled 2020-11-15: qty 7

## 2020-11-15 MED ORDER — OXYCODONE HCL 5 MG PO TABS
5.0000 mg | ORAL_TABLET | ORAL | Status: DC | PRN
  Administered 2020-11-15: 5 mg via ORAL
  Filled 2020-11-15: qty 1

## 2020-11-15 MED ORDER — OXYCODONE HCL 5 MG/5ML PO SOLN: 5.0000 mg | Freq: Once | ORAL | Status: DC | PRN

## 2020-11-15 MED ORDER — PROPOFOL 10 MG/ML IV BOLUS
INTRAVENOUS | Status: AC
  Filled 2020-11-15: qty 20

## 2020-11-15 MED ORDER — ONDANSETRON HCL 4 MG/2ML IJ SOLN: 4.0000 mg | INTRAMUSCULAR | Status: DC | PRN

## 2020-11-15 MED ORDER — ROCURONIUM BROMIDE 10 MG/ML (PF) SYRINGE
PREFILLED_SYRINGE | INTRAVENOUS | Status: DC | PRN
  Administered 2020-11-15: 10 mg via INTRAVENOUS
  Administered 2020-11-15: 60 mg via INTRAVENOUS

## 2020-11-15 MED ORDER — LATANOPROST 0.005 % OP SOLN
1.0000 [drp] | Freq: Every day | OPHTHALMIC | Status: DC
  Filled 2020-11-15: qty 2.5

## 2020-11-15 MED ORDER — FENTANYL CITRATE (PF) 100 MCG/2ML IJ SOLN
INTRAMUSCULAR | Status: DC | PRN
  Administered 2020-11-15 (×3): 50 ug via INTRAVENOUS

## 2020-11-15 MED ORDER — PHENYLEPHRINE 40 MCG/ML (10ML) SYRINGE FOR IV PUSH (FOR BLOOD PRESSURE SUPPORT)
PREFILLED_SYRINGE | INTRAVENOUS | Status: AC
  Filled 2020-11-15: qty 10

## 2020-11-15 MED ORDER — ACETAMINOPHEN 500 MG PO TABS
1000.0000 mg | ORAL_TABLET | Freq: Four times a day (QID) | ORAL | Status: DC
  Administered 2020-11-15 – 2020-11-16 (×2): 1000 mg via ORAL
  Filled 2020-11-15 (×2): qty 2

## 2020-11-15 MED ORDER — ROCURONIUM BROMIDE 10 MG/ML (PF) SYRINGE
PREFILLED_SYRINGE | INTRAVENOUS | Status: AC
  Filled 2020-11-15: qty 10

## 2020-11-15 MED ORDER — GLYCOPYRROLATE 0.2 MG/ML IJ SOLN
INTRAMUSCULAR | Status: DC | PRN
  Administered 2020-11-15: .2 mg via INTRAVENOUS

## 2020-11-15 MED ORDER — LIDOCAINE 2% (20 MG/ML) 5 ML SYRINGE
INTRAMUSCULAR | Status: DC | PRN
  Administered 2020-11-15: 100 mg via INTRAVENOUS

## 2020-11-15 MED ORDER — CLINDAMYCIN PHOSPHATE 2 % VA CREA
TOPICAL_CREAM | VAGINAL | Status: DC | PRN
  Administered 2020-11-15: 2 via VAGINAL

## 2020-11-15 MED ORDER — LIDOCAINE 2% (20 MG/ML) 5 ML SYRINGE
INTRAMUSCULAR | Status: AC
  Filled 2020-11-15: qty 5

## 2020-11-15 MED ORDER — LEVOTHYROXINE SODIUM 100 MCG PO TABS
100.0000 ug | ORAL_TABLET | Freq: Every day | ORAL | Status: DC
  Administered 2020-11-16: 100 ug via ORAL
  Filled 2020-11-15: qty 1

## 2020-11-15 MED ORDER — MIDAZOLAM HCL 5 MG/5ML IJ SOLN
INTRAMUSCULAR | Status: DC | PRN
  Administered 2020-11-15: 1 mg via INTRAVENOUS

## 2020-11-15 MED ORDER — CLINDAMYCIN PHOSPHATE 2 % VA CREA
TOPICAL_CREAM | VAGINAL | Status: AC
  Filled 2020-11-15: qty 40

## 2020-11-15 MED ORDER — FENTANYL CITRATE (PF) 100 MCG/2ML IJ SOLN
INTRAMUSCULAR | Status: AC
  Filled 2020-11-15: qty 2

## 2020-11-15 MED ORDER — LIDOCAINE-EPINEPHRINE 1 %-1:100000 IJ SOLN
INTRAMUSCULAR | Status: AC
  Filled 2020-11-15: qty 1

## 2020-11-15 MED ORDER — MIDAZOLAM HCL 2 MG/2ML IJ SOLN
INTRAMUSCULAR | Status: AC
  Filled 2020-11-15: qty 2

## 2020-11-15 MED ORDER — ATROPINE SULFATE 0.4 MG/ML IJ SOLN
INTRAMUSCULAR | Status: DC | PRN
  Administered 2020-11-15: .2 mg via INTRAVENOUS

## 2020-11-15 MED ORDER — DORZOLAMIDE HCL-TIMOLOL MAL 2-0.5 % OP SOLN
1.0000 [drp] | Freq: Two times a day (BID) | OPHTHALMIC | Status: DC
  Administered 2020-11-15 – 2020-11-16 (×2): 1 [drp] via OPHTHALMIC
  Filled 2020-11-15 (×2): qty 10

## 2020-11-15 MED ORDER — CHLORHEXIDINE GLUCONATE 0.12 % MT SOLN
15.0000 mL | Freq: Once | OROMUCOSAL | Status: AC
  Administered 2020-11-15: 15 mL via OROMUCOSAL

## 2020-11-15 MED ORDER — WATER FOR IRRIGATION, STERILE IR SOLN
Status: DC | PRN
  Administered 2020-11-15: 1000 mL

## 2020-11-15 MED ORDER — GLYCOPYRROLATE 0.2 MG/ML IJ SOLN
INTRAMUSCULAR | Status: AC
  Filled 2020-11-15: qty 4

## 2020-11-15 MED ORDER — EPHEDRINE SULFATE-NACL 50-0.9 MG/10ML-% IV SOSY
PREFILLED_SYRINGE | INTRAVENOUS | Status: DC | PRN
  Administered 2020-11-15 (×2): 5 mg via INTRAVENOUS

## 2020-11-15 MED ORDER — ONDANSETRON HCL 4 MG/2ML IJ SOLN
INTRAMUSCULAR | Status: DC | PRN
  Administered 2020-11-15: 4 mg via INTRAVENOUS

## 2020-11-15 MED ORDER — ORAL CARE MOUTH RINSE: 15.0000 mL | Freq: Once | OROMUCOSAL | Status: AC

## 2020-11-15 MED ORDER — DEXAMETHASONE SODIUM PHOSPHATE 10 MG/ML IJ SOLN
INTRAMUSCULAR | Status: AC
  Filled 2020-11-15: qty 1

## 2020-11-15 MED ORDER — SUGAMMADEX SODIUM 200 MG/2ML IV SOLN
INTRAVENOUS | Status: DC | PRN
  Administered 2020-11-15: 200 mg via INTRAVENOUS

## 2020-11-15 MED ORDER — SODIUM CHLORIDE 0.9 % IR SOLN
Status: DC | PRN
  Administered 2020-11-15: 2000 mL via INTRAVESICAL

## 2020-11-15 MED ORDER — LIDOCAINE-EPINEPHRINE (PF) 1 %-1:200000 IJ SOLN
INTRAMUSCULAR | Status: DC | PRN
  Administered 2020-11-15: 25 mL

## 2020-11-15 MED ORDER — OXYCODONE HCL 5 MG PO TABS: 5.0000 mg | ORAL_TABLET | Freq: Once | ORAL | Status: DC | PRN

## 2020-11-15 MED ORDER — AMISULPRIDE (ANTIEMETIC) 5 MG/2ML IV SOLN: 10.0000 mg | Freq: Once | INTRAVENOUS | Status: DC | PRN

## 2020-11-15 MED ORDER — AMLODIPINE BESYLATE 5 MG PO TABS
5.0000 mg | ORAL_TABLET | ORAL | Status: DC
  Administered 2020-11-16: 5 mg via ORAL
  Filled 2020-11-15: qty 1

## 2020-11-15 MED ORDER — PHENAZOPYRIDINE HCL 200 MG PO TABS
200.0000 mg | ORAL_TABLET | Freq: Once | ORAL | Status: AC
  Administered 2020-11-15: 200 mg via ORAL
  Filled 2020-11-15: qty 1

## 2020-11-15 MED ORDER — PROPOFOL 10 MG/ML IV BOLUS
INTRAVENOUS | Status: DC | PRN
  Administered 2020-11-15: 100 mg via INTRAVENOUS
  Administered 2020-11-15: 50 mg via INTRAVENOUS

## 2020-11-15 MED ORDER — MORPHINE SULFATE (PF) 2 MG/ML IV SOLN: 2.0000 mg | INTRAVENOUS | Status: DC | PRN

## 2020-11-15 MED ORDER — LACTATED RINGERS IV SOLN
INTRAVENOUS | Status: DC
  Administered 2020-11-15: 1000 mL via INTRAVENOUS

## 2020-11-15 MED ORDER — ONDANSETRON HCL 4 MG/2ML IJ SOLN
INTRAMUSCULAR | Status: AC
  Filled 2020-11-15: qty 2

## 2020-11-15 MED ORDER — SENNOSIDES-DOCUSATE SODIUM 8.6-50 MG PO TABS
2.0000 | ORAL_TABLET | Freq: Every day | ORAL | Status: DC
  Administered 2020-11-15: 2 via ORAL
  Filled 2020-11-15: qty 2

## 2020-11-15 MED ORDER — CHLORHEXIDINE GLUCONATE CLOTH 2 % EX PADS
6.0000 | MEDICATED_PAD | Freq: Every day | CUTANEOUS | Status: DC
  Administered 2020-11-15: 6 via TOPICAL

## 2020-11-15 MED ORDER — FENTANYL CITRATE PF 50 MCG/ML IJ SOSY: 25.0000 ug | PREFILLED_SYRINGE | INTRAMUSCULAR | Status: DC | PRN

## 2020-11-15 MED ORDER — CLINDAMYCIN PHOSPHATE 900 MG/50ML IV SOLN
900.0000 mg | INTRAVENOUS | Status: AC
  Administered 2020-11-15: 900 mg via INTRAVENOUS
  Filled 2020-11-15: qty 50

## 2020-11-15 MED ORDER — PHENYLEPHRINE 40 MCG/ML (10ML) SYRINGE FOR IV PUSH (FOR BLOOD PRESSURE SUPPORT)
PREFILLED_SYRINGE | INTRAVENOUS | Status: DC | PRN
  Administered 2020-11-15 (×5): 40 ug via INTRAVENOUS
  Administered 2020-11-15: 80 ug via INTRAVENOUS

## 2020-11-15 SURGICAL SUPPLY — 69 items
ALLOGRAFT TUTOPLAST AXIS 6X12 (Tissue) IMPLANT
BAG COUNTER SPONGE SURGICOUNT (BAG) ×1 IMPLANT
BAG DECANTER FOR FLEXI CONT (MISCELLANEOUS) ×1 IMPLANT
BAG DRN RND TRDRP ANRFLXCHMBR (UROLOGICAL SUPPLIES)
BAG SPNG CNTER NS LX DISP (BAG) ×1
BAG URINE DRAIN 2000ML AR STRL (UROLOGICAL SUPPLIES) IMPLANT
BLADE SURG 15 STRL LF DISP TIS (BLADE) ×1 IMPLANT
BLADE SURG 15 STRL SS (BLADE) ×2
CATH FOLEY 2WAY SLVR  5CC 14FR (CATHETERS) ×2
CATH FOLEY 2WAY SLVR 5CC 14FR (CATHETERS) ×1 IMPLANT
COVER MAYO STAND STRL (DRAPES) ×2 IMPLANT
COVER SURGICAL LIGHT HANDLE (MISCELLANEOUS) ×2 IMPLANT
Capio Violet monfilament 0 1x46 ×2 IMPLANT
DECANTER SPIKE VIAL GLASS SM (MISCELLANEOUS) ×2 IMPLANT
DEVICE CAPIO SLIM SINGLE (INSTRUMENTS) ×1 IMPLANT
DRAIN PENROSE 0.25X18 (DRAIN) ×2 IMPLANT
DRAPE UNDERBUTTOCKS STRL (DISPOSABLE) ×2 IMPLANT
ELECT PENCIL ROCKER SW 15FT (MISCELLANEOUS) ×2 IMPLANT
GAUZE 4X4 16PLY ~~LOC~~+RFID DBL (SPONGE) ×5 IMPLANT
GAUZE PACKING 2X5 YD STRL (GAUZE/BANDAGES/DRESSINGS) ×2 IMPLANT
GLOVE SURG ENC MOIS LTX SZ6 (GLOVE) ×1 IMPLANT
GLOVE SURG ENC MOIS LTX SZ6.5 (GLOVE) ×4 IMPLANT
GLOVE SURG ENC MOIS LTX SZ7.5 (GLOVE) ×1 IMPLANT
GLOVE SURG ENC TEXT LTX SZ7.5 (GLOVE) ×2 IMPLANT
GLOVE SURG NEOPR MICRO LF 8.5 (GLOVE) ×2 IMPLANT
GLOVE SURG POLY ORTHO LF SZ7.5 (GLOVE) ×1 IMPLANT
GLOVE SURG UNDER POLY LF SZ6 (GLOVE) ×1 IMPLANT
GLOVE SURG UNDER POLY LF SZ6.5 (GLOVE) ×2 IMPLANT
GLOVE SURG UNDER POLY LF SZ7.5 (GLOVE) ×1 IMPLANT
GOWN STRL REUS W/TWL LRG LVL3 (GOWN DISPOSABLE) ×1 IMPLANT
GOWN STRL REUS W/TWL XL LVL3 (GOWN DISPOSABLE) ×4 IMPLANT
HOLDER FOLEY CATH W/STRAP (MISCELLANEOUS) ×2 IMPLANT
IV NS 1000ML (IV SOLUTION) ×4
IV NS 1000ML BAXH (IV SOLUTION) ×1 IMPLANT
KIT BASIN OR (CUSTOM PROCEDURE TRAY) ×2 IMPLANT
KIT TURNOVER KIT A (KITS) ×2 IMPLANT
NDL MAYO 6 CRC TAPER PT (NEEDLE) ×1 IMPLANT
NEEDLE HYPO 22GX1.5 SAFETY (NEEDLE) ×2 IMPLANT
NEEDLE MAYO 6 CRC TAPER PT (NEEDLE) ×2 IMPLANT
NS IRRIG 1000ML POUR BTL (IV SOLUTION) ×1 IMPLANT
PACK CYSTO (CUSTOM PROCEDURE TRAY) ×2 IMPLANT
PAD OB MATERNITY 4.3X12.25 (PERSONAL CARE ITEMS) ×2 IMPLANT
PANTS MESH DISP LRG (UNDERPADS AND DIAPERS) ×1 IMPLANT
PANTS MESH DISPOSABLE L (UNDERPADS AND DIAPERS) ×1
PLUG CATH AND CAP STER (CATHETERS) ×2 IMPLANT
PROTECTOR NERVE ULNAR (MISCELLANEOUS) ×2 IMPLANT
RETRACTOR STAY HOOK 5MM (MISCELLANEOUS) ×2 IMPLANT
SHEET LAVH (DRAPES) ×2 IMPLANT
SUT ABS MONO DBL WITH NDL 48IN (SUTURE) ×2 IMPLANT
SUT CAPIO ETHIBPND (SUTURE) IMPLANT
SUT SILK 2 0 30  PSL (SUTURE) ×2
SUT SILK 2 0 30 PSL (SUTURE) IMPLANT
SUT VIC AB 0 CT1 27 (SUTURE) ×2
SUT VIC AB 0 CT1 27XBRD ANTBC (SUTURE) ×1 IMPLANT
SUT VIC AB 2-0 CT1 27 (SUTURE) ×4
SUT VIC AB 2-0 CT1 27XBRD (SUTURE) ×2 IMPLANT
SUT VIC AB 2-0 CT1 TAPERPNT 27 (SUTURE) IMPLANT
SUT VIC AB 2-0 SH 27 (SUTURE) ×4
SUT VIC AB 2-0 SH 27X BRD (SUTURE) ×2 IMPLANT
SUT VIC AB 3-0 SH 27 (SUTURE) ×4
SUT VIC AB 3-0 SH 27XBRD (SUTURE) ×2 IMPLANT
SUT VICRYL 0 UR6 27IN ABS (SUTURE) ×4 IMPLANT
SYR 10ML LL (SYRINGE) ×2 IMPLANT
TOWEL OR 17X26 10 PK STRL BLUE (TOWEL DISPOSABLE) ×2 IMPLANT
TOWEL OR NON WOVEN STRL DISP B (DISPOSABLE) ×2 IMPLANT
TUBING CONNECTING 10 (TUBING) ×4 IMPLANT
TUTOPLAST AXIS 6X12 (Tissue) IMPLANT
UNDERPAD 30X36 HEAVY ABSORB (UNDERPADS AND DIAPERS) ×2 IMPLANT
WATER STERILE IRR 1000ML POUR (IV SOLUTION) ×2 IMPLANT

## 2020-11-15 NOTE — Plan of Care (Signed)
80 y/o female s/p cystocele repair and cystoscopy. Admitted to unit AAOx4, calm and cooperative. She has perineal pad in place which is dry. Foley catheter draining clear amber-colored urine. Reports mild pelvic discomfort during encounter.Discussed POC with patient and daughter. All questions answered.

## 2020-11-15 NOTE — Anesthesia Postprocedure Evaluation (Signed)
Anesthesia Post Note  Patient: Jenny [name]  Procedure(s) Performed: ANTERIOR REPAIR (CYSTOCELE) / CYSTOSCOPY (Vagina )     Patient location during evaluation: PACU Anesthesia Type: General Level of consciousness: awake and alert Pain management: pain level controlled Vital Signs Assessment: post-procedure vital signs reviewed and stable Respiratory status: spontaneous breathing, nonlabored ventilation and respiratory function stable Cardiovascular status: blood pressure returned to baseline and stable Postop Assessment: no apparent nausea or vomiting Anesthetic complications: no   No notable events documented.  Last Vitals:  Vitals:   11/15/20 1100 11/15/20 1200  BP: 136/80 134/75  Pulse: 71 66  Resp: 20 (!) 22  Temp: 36.7 C   SpO2: 100% 96%    Last Pain:  Vitals:   11/15/20 1200  TempSrc:   PainSc: 0-No pain                 Lidia Collum

## 2020-11-15 NOTE — Anesthesia Procedure Notes (Signed)
Procedure Name: Intubation Date/Time: 11/15/2020 7:36 AM Performed by: Claudia Desanctis, CRNA Pre-anesthesia Checklist: Patient identified, Emergency Drugs available, Suction available and Patient being monitored Patient Re-evaluated:Patient Re-evaluated prior to induction Oxygen Delivery Method: Circle system utilized Preoxygenation: Pre-oxygenation with 100% oxygen Induction Type: IV induction Ventilation: Mask ventilation without difficulty Laryngoscope Size: 2 and Miller Grade View: Grade I Tube type: Oral Tube size: 7.0 mm Number of attempts: 1 Airway Equipment and Method: Stylet Placement Confirmation: ETT inserted through vocal cords under direct vision, positive ETCO2 and breath sounds checked- equal and bilateral Secured at: 22 cm Tube secured with: Tape Dental Injury: Teeth and Oropharynx as per pre-operative assessment

## 2020-11-15 NOTE — Anesthesia Preprocedure Evaluation (Signed)
Anesthesia Evaluation  Patient identified by MRN, date of birth, ID band Patient awake    Reviewed: Allergy & Precautions, NPO status , Patient's Chart, lab work & pertinent test results  History of Anesthesia Complications Negative for: history of anesthetic complications  Airway Mallampati: II  TM Distance: >3 FB Neck ROM: Full    Dental  (+) Upper Dentures, Partial Lower, Dental Advisory Given   Pulmonary neg pulmonary ROS,    Pulmonary exam normal        Cardiovascular hypertension, Pt. on medications Normal cardiovascular exam     Neuro/Psych negative neurological ROS     GI/Hepatic negative GI ROS, Neg liver ROS,   Endo/Other  Hypothyroidism   Renal/GU negative Renal ROS  negative genitourinary   Musculoskeletal negative musculoskeletal ROS (+)   Abdominal   Peds  Hematology negative hematology ROS (+) Remote h/o breast cancer   Anesthesia Other Findings   Reproductive/Obstetrics                            Anesthesia Physical Anesthesia Plan  ASA: 2  Anesthesia Plan: General   Post-op Pain Management:    Induction: Intravenous  PONV Risk Score and Plan: 3 and Ondansetron, Dexamethasone, Treatment may vary due to age or medical condition and Midazolam  Airway Management Planned: Oral ETT  Additional Equipment: None  Intra-op Plan:   Post-operative Plan: Extubation in OR  Informed Consent: I have reviewed the patients History and Physical, chart, labs and discussed the procedure including the risks, benefits and alternatives for the proposed anesthesia with the patient or authorized representative who has indicated his/her understanding and acceptance.     Dental advisory given  Plan Discussed with:   Anesthesia Plan Comments:         Anesthesia Quick Evaluation

## 2020-11-15 NOTE — Op Note (Signed)
Preoperative diagnosis: Midline cystocele and mild vault prolapse Postoperative diagnosis: Middle cystocele and mild vault prolapse Surgery: Cystocele repair and cystoscopy Surgeon: Dr. Nicki Reaper Monti Villers Assistant: Dr. Jimmy Footman  The patient has the above diagnosis and consented the above procedure.  Extra care was taken with leg positioning to minimize the risk of compartment syndrome and neuropathy and deep vein thrombosis.  Preoperative antibiotics were given.  She had a narrow pubic arch at baseline.  She had a moderate grade 2 cystocele with anterior defect.  Vaginal cuff descended from 8 or 9 cm to approximately 6 cm.  It was easily identified.  3-0 Vicryl was placed at the level of the cuff.  Approximately 25 cc of lidocaine epinephrine mixture was utilized.  I used my Allis clamps near the apex and made a T-shaped anterior vaginal wall incision.  I mobilized the anterior vaginal wall from the underlying pubocervical fascia to the white line bilaterally.  I mobilized very well at the apex.  She did have some shortening of the anterior vaginal wall but with mobilization she had a central defect to imbricate.  Using 2-0 Vicryl on SH needle I did an anterior repair not imbricating the bladder neck.  It was a 2 layer repair.  It reduced central defect.  I was very pleased with the strength and length of the repair and it was anatomic  I cystoscoped the patient.  There was no injury to bladder.  Cystoscopically she had good repair of the cystocele.  There is no deviation of ureters.  There is excellent efflux bilaterally of the ureters.  At this point I finger dissected in the appropriate plane to the left ischial spine.  The spine was very flat and there was a lot of soft tissue around it.  I spent approximately 15 minutes trying to mobilize to the appropriate plane near the spine and sweep tissues medially.  It appeared like there was a lot of soft tissue matted in this area and I could not find  the appropriate plane and sweep tissues medially.  From the initial dissection the findings were consistent and the spine and anatomic landmarks were difficult to feel.  I did not feel safe placing a suture medially and I really could not feel a sacrospinous ligament clearly  I took down my retractor and she had a good repair with the anterior repair only.  She still had weakness of the apex but it was minimal.  I trimmed an appropriate amount of anterior vaginal wall epithelium and closed anterior vaginal wall with running 2-0 Vicryl on a CT1 needle.  At the end of the case I was pleased with the repair.  Unfortunately I was not able to do the sacrospinous fixation safely.  I did not do a unilateral repair.  Vaginal pack with clindamycin cream was applied x2.  Leg position was excellent.  Urine output was excellent.  Blood loss was less than 50 mL.  If the patient recurs apically she would need a robotic sacrocolpopexy.  At the beginning of the case the patient had a very slow heart rate to 26 and was given atropine.  Anesthesia was okay with proceeding.  Apparently the same thing happened in her medical record when she had a colonoscopy.

## 2020-11-15 NOTE — Transfer of Care (Signed)
Immediate Anesthesia Transfer of Care Note  Patient: Maliyha Shelton  Procedure(s) Performed: ANTERIOR REPAIR (CYSTOCELE) / CYSTOSCOPY (Vagina )  Patient Location: PACU  Anesthesia Type:General  Level of Consciousness: awake and patient cooperative  Airway & Oxygen Therapy: Patient Spontanous Breathing and Patient connected to face mask  Post-op Assessment: Report given to RN and Post -op Vital signs reviewed and stable  Post vital signs: Reviewed and stable  Last Vitals:  Vitals Value Taken Time  BP 130/79 11/15/20 0952  Temp    Pulse 72 11/15/20 0955  Resp 18 11/15/20 0954  SpO2 100 % 11/15/20 0955  Vitals shown include unvalidated device data.  Last Pain:  Vitals:   11/15/20 0625  TempSrc:   PainSc: 0-No pain         Complications: No notable events documented.

## 2020-11-15 NOTE — Interval H&P Note (Signed)
History and Physical Interval Note:  11/15/2020 7:12 AM  Jenny [name]  has presented today for surgery, with the diagnosis of CYSTOCELE VAULT PROLAPSE.  The various methods of treatment have been discussed with the patient and family. After consideration of risks, benefits and other options for treatment, the patient has consented to  Procedure(s) with comments: ANTERIOR REPAIR (CYSTOCELE) VAULT PROLAPSE GRAFT CYSTOSCOPY (N/A) - REQUESTING 2.5 HRS as a surgical intervention.  The patient's history has been reviewed, patient examined, no change in status, stable for surgery.  I have reviewed the patient's chart and labs.  Questions were answered to the patient's satisfaction.     Harriet Sutphen A Jarod Bozzo

## 2020-11-16 DIAGNOSIS — N8111 Cystocele, midline: Secondary | ICD-10-CM | POA: Diagnosis not present

## 2020-11-16 DIAGNOSIS — I10 Essential (primary) hypertension: Secondary | ICD-10-CM | POA: Diagnosis not present

## 2020-11-16 DIAGNOSIS — E039 Hypothyroidism, unspecified: Secondary | ICD-10-CM | POA: Diagnosis not present

## 2020-11-16 DIAGNOSIS — E119 Type 2 diabetes mellitus without complications: Secondary | ICD-10-CM | POA: Diagnosis not present

## 2020-11-16 DIAGNOSIS — Z79899 Other long term (current) drug therapy: Secondary | ICD-10-CM | POA: Diagnosis not present

## 2020-11-16 DIAGNOSIS — Z853 Personal history of malignant neoplasm of breast: Secondary | ICD-10-CM | POA: Diagnosis not present

## 2020-11-16 LAB — BASIC METABOLIC PANEL
Anion gap: 7 (ref 5–15)
BUN: 16 mg/dL (ref 8–23)
CO2: 23 mmol/L (ref 22–32)
Calcium: 8.9 mg/dL (ref 8.9–10.3)
Chloride: 109 mmol/L (ref 98–111)
Creatinine, Ser: 0.86 mg/dL (ref 0.44–1.00)
GFR, Estimated: >60 mL/min (ref >60)
Glucose, Bld: 131 mg/dL — ABNORMAL HIGH (ref 70–99)
Potassium: 4.1 mmol/L (ref 3.5–5.1)
Sodium: 139 mmol/L (ref 135–145)

## 2020-11-16 LAB — CBC
HCT: 38.9 % (ref 36.0–46.0)
Hemoglobin: 13.3 g/dL (ref 12.0–15.0)
MCH: 31.6 pg (ref 26.0–34.0)
MCHC: 34.2 g/dL (ref 30.0–36.0)
MCV: 92.4 fL (ref 80.0–100.0)
Platelets: 158 10*3/uL (ref 150–400)
RBC: 4.21 MIL/uL (ref 3.87–5.11)
RDW: 12.5 % (ref 11.5–15.5)
WBC: 15 10*3/uL — ABNORMAL HIGH (ref 4.0–10.5)
nRBC: 0 % (ref 0.0–0.2)

## 2020-11-16 MED ORDER — SENNA 8.6 MG PO TABS: 1.0000 | ORAL_TABLET | Freq: Every day | ORAL | 0 refills | Status: AC

## 2020-11-16 MED ORDER — TRAMADOL HCL 50 MG PO TABS: 50.0000 mg | ORAL_TABLET | Freq: Four times a day (QID) | ORAL | 0 refills | Status: AC | PRN

## 2020-11-16 NOTE — Discharge Summary (Signed)
Date of admission: 11/15/2020  Date of discharge: 11/16/2020  Admission diagnosis: Midline cystocele and mild vault prolapse  Discharge diagnosis: Midline cystocele and mild vault prolapse  Secondary diagnoses:  Patient Active Problem List   Diagnosis Date Noted   Prolapse of female pelvic organs 11/15/2020    Procedures performed: Procedure(s): ANTERIOR REPAIR (CYSTOCELE) / CYSTOSCOPY  History and Physical: For full details, please see admission history and physical. Briefly, Jenny [name] is a 80 y.o. year old patient with symptomatic midline cystocele and mild vault prolapse.   Hospital Course: Patient tolerated the procedure well.  She was then transferred to the floor after an uneventful PACU stay.  Her hospital course was uncomplicated.  On POD#1 she had met discharge criteria: was eating a regular diet, was up and ambulating independently,  pain was well controlled, was voiding without a catheter, and was ready to for discharge.   Laboratory values:  Recent Labs    11/16/20 0407  WBC 15.0*  HGB 13.3  HCT 38.9   Recent Labs    11/16/20 0407  NA 139  K 4.1  CL 109  CO2 23  GLUCOSE 131*  BUN 16  CREATININE 0.86  CALCIUM 8.9   No results for input(s): LABPT, INR in the last 72 hours. No results for input(s): LABURIN in the last 72 hours. Results for orders placed or performed in visit on 11/11/20  SARS Coronavirus 2 (TAT 6-24 hrs)     Status: None   Collection Time: 11/11/20 12:00 AM  Result Value Ref Range Status   SARS Coronavirus 2 RESULT: NEGATIVE  Final    Comment: RESULT: NEGATIVESARS-CoV-2 INTERPRETATION:A NEGATIVE  test result means that SARS-CoV-2 RNA was not present in the specimen above the limit of detection of this test. This does not preclude a possible SARS-CoV-2 infection and should not be used as the  sole basis for patient management decisions. Negative results must be combined with clinical observations, patient history, and epidemiological  information. Optimum specimen types and timing for peak viral levels during infections caused by SARS-CoV-2  have not been determined. Collection of multiple specimens or types of specimens may be necessary to detect virus. Improper specimen collection and handling, sequence variability under primers/probes, or organism present below the limit of detection may  lead to false negative results. Positive and negative predictive values of testing are highly dependent on prevalence. False negative test results are more likely when prevalence of disease is high.The expected result is NEGATIVE.Fact S heet for  Healthcare Providers: LocalChronicle.no Sheet for Patients: SalonLookup.es Reference Range - Negative     Disposition: Home  Discharge instruction: The patient was instructed to be ambulatory but told to refrain from heavy lifting, strenuous activity, or driving.  Discharge medications:  Allergies as of 11/16/2020   No Known Allergies      Medication List     TAKE these medications    amLODipine 5 MG tablet Commonly known as: NORVASC Take 5 mg by mouth every morning.   aspirin 81 MG tablet Take 81 mg by mouth every morning.   dorzolamide-timolol 22.3-6.8 MG/ML ophthalmic solution Commonly known as: COSOPT Place 1 drop into both eyes 2 (two) times daily.   latanoprost 0.005 % ophthalmic solution Commonly known as: XALATAN Place 1 drop into both eyes at bedtime.   lisinopril 10 MG tablet Commonly known as: ZESTRIL Take 20 mg by mouth daily.   multivitamin with minerals Tabs tablet Take 1 tablet by mouth daily.   senna 8.6 MG  Tabs tablet Commonly known as: SENOKOT Take 1 tablet (8.6 mg total) by mouth at bedtime for 10 days.   Synthroid 100 MCG tablet Generic drug: levothyroxine Take 100 mcg by mouth daily before breakfast.   traMADol 50 MG tablet Commonly known as: Ultram Take 1 tablet (50 mg total) by mouth  every 6 (six) hours as needed for up to 3 days.        Followup: Will be scheduled in the coming weeks.

## 2020-11-16 NOTE — Progress Notes (Signed)
Pt discharged to home, instructions reviewed with pt and family, acknowledged understanding of post op  care, and follow up if  post bleeding or pain symptoms. Pt transported by spouse and son. SRP, RN

## 2020-11-16 NOTE — Progress Notes (Signed)
Pt urine output has gradually improved past 4 hours. 9am- voided 25 cc with PVR 0, 1145 am pt voided 100 w/PVR 206cc, 1215 pt voided 300 w/PVR 123, 1310 voided 350w/ PVR 72. Clear yellow urine. Pt ambulated several rounds on the unit with spouse, MD updated. SRP, RN

## 2020-11-16 NOTE — Progress Notes (Signed)
Vitals and labs good No pain Spoke to patient and family and daughter re findings and apical dissection Agree with resident note D/c home today

## 2020-11-16 NOTE — Discharge Instructions (Signed)
Activity:  You are encouraged to ambulate frequently (about every hour during waking hours) to help prevent blood clots from forming in your legs or lungs.  However, you should not engage in any heavy lifting (> 10-15 lbs), strenuous activity, or straining.  Diet: You should advance your diet as instructed by your physician.  It will be normal to have some bloating, nausea, and abdominal discomfort intermittently.  Prescriptions:  You will be provided a prescription for pain medication to take as needed.  If your pain is not severe enough to require the prescription pain medication, you may take extra strength Tylenol instead which will have less side effects.  You should also take a prescribed stool softener to avoid straining with bowel movements as the prescription pain medication may constipate you.  Incisions: The incisions may stay open to air.  You may start showering (but not soaking or bathing in water) the 2nd day after surgery. No sexual intercourse or insertion of anything in the vagina for 6 weeks.  No additional care is needed.  What to call us about: You should call the office [phone]) if you develop fever > 101 or develop persistent vomiting. Activity:  You are encouraged to ambulate frequently (about every hour during waking hours) to help prevent blood clots from forming in your legs or lungs.  However, you should not engage in any heavy lifting (> 10-15 lbs), strenuous activity, or straining.

## 2020-11-16 NOTE — Discharge Summary (Signed)
Date of admission: 11/15/2020  Date of discharge: 11/16/2020  Admission diagnosis: cystocele   Discharge diagnosis: midline cystocele  Secondary diagnoses: mild vault prolapse  History and Physical: For full details, please see admission history and physical. Briefly, Jenny [name] is a 80 y.o. year old patient with the above diagnosis.   Hospital Course: Cystocele repair went well; unable to address apex as noted. Good post op course  Laboratory values:  Recent Labs    11/16/20 0407  HGB 13.3  HCT 38.9   Recent Labs    11/16/20 0407  CREATININE 0.86    Disposition: Home  Discharge instruction: The patient was instructed to be ambulatory but told to refrain from heavy lifting, strenuous activity, or driving. Detailed  Discharge medications:  Allergies as of 11/16/2020   No Known Allergies      Medication List     TAKE these medications    amLODipine 5 MG tablet Commonly known as: NORVASC Take 5 mg by mouth every morning.   aspirin 81 MG tablet Take 81 mg by mouth every morning.   dorzolamide-timolol 22.3-6.8 MG/ML ophthalmic solution Commonly known as: COSOPT Place 1 drop into both eyes 2 (two) times daily.   latanoprost 0.005 % ophthalmic solution Commonly known as: XALATAN Place 1 drop into both eyes at bedtime.   lisinopril 10 MG tablet Commonly known as: ZESTRIL Take 20 mg by mouth daily.   multivitamin with minerals Tabs tablet Take 1 tablet by mouth daily.   senna 8.6 MG Tabs tablet Commonly known as: SENOKOT Take 1 tablet (8.6 mg total) by mouth at bedtime for 10 days.   Synthroid 100 MCG tablet Generic drug: levothyroxine Take 100 mcg by mouth daily before breakfast.   traMADol 50 MG tablet Commonly known as: Ultram Take 1 tablet (50 mg total) by mouth every 6 (six) hours as needed for up to 3 days.        Followup:

## 2020-11-16 NOTE — Plan of Care (Signed)

## 2020-11-17 ENCOUNTER — Encounter (HOSPITAL_COMMUNITY): Payer: Self-pay | Admitting: Urology

## 2020-11-21 ENCOUNTER — Other Ambulatory Visit (HOSPITAL_COMMUNITY): Payer: Self-pay | Admitting: Urology

## 2020-11-21 DIAGNOSIS — M79605 Pain in left leg: Secondary | ICD-10-CM

## 2020-11-21 DIAGNOSIS — M7989 Other specified soft tissue disorders: Secondary | ICD-10-CM

## 2020-11-22 ENCOUNTER — Ambulatory Visit (HOSPITAL_COMMUNITY)
Admission: RE | Admit: 2020-11-22 | Discharge: 2020-11-22 | Disposition: A | Discharge status: Home / Self Care | Payer: Medicare Other | Source: Ambulatory Visit | Attending: *Deleted | Admitting: *Deleted

## 2020-11-22 ENCOUNTER — Other Ambulatory Visit: Payer: Self-pay

## 2020-11-22 DIAGNOSIS — M79605 Pain in left leg: Secondary | ICD-10-CM | POA: Diagnosis not present

## 2020-11-22 DIAGNOSIS — M7989 Other specified soft tissue disorders: Secondary | ICD-10-CM | POA: Diagnosis not present

## 2020-11-22 NOTE — Progress Notes (Signed)
Left lower extremity venous duplex has been completed. Preliminary results can be found in CV Proc through chart review.  Results were given to Dr. Lovena Neighbours.  11/22/20 8:49 AM Jenny [name] RVT

## 2021-01-23 DIAGNOSIS — H401131 Primary open-angle glaucoma, bilateral, mild stage: Secondary | ICD-10-CM | POA: Diagnosis not present

## 2021-01-23 DIAGNOSIS — H2513 Age-related nuclear cataract, bilateral: Secondary | ICD-10-CM | POA: Diagnosis not present

## 2021-01-23 DIAGNOSIS — H35371 Puckering of macula, right eye: Secondary | ICD-10-CM | POA: Diagnosis not present

## 2021-01-25 DIAGNOSIS — I10 Essential (primary) hypertension: Secondary | ICD-10-CM | POA: Diagnosis not present

## 2021-01-25 DIAGNOSIS — R609 Edema, unspecified: Secondary | ICD-10-CM | POA: Diagnosis not present

## 2021-02-06 DIAGNOSIS — R609 Edema, unspecified: Secondary | ICD-10-CM | POA: Diagnosis not present

## 2021-02-06 DIAGNOSIS — I10 Essential (primary) hypertension: Secondary | ICD-10-CM | POA: Diagnosis not present

## 2021-03-30 DIAGNOSIS — H35371 Puckering of macula, right eye: Secondary | ICD-10-CM | POA: Diagnosis not present

## 2021-03-30 DIAGNOSIS — H353131 Nonexudative age-related macular degeneration, bilateral, early dry stage: Secondary | ICD-10-CM | POA: Diagnosis not present

## 2021-03-30 DIAGNOSIS — H43811 Vitreous degeneration, right eye: Secondary | ICD-10-CM | POA: Diagnosis not present

## 2021-03-30 DIAGNOSIS — H2513 Age-related nuclear cataract, bilateral: Secondary | ICD-10-CM | POA: Diagnosis not present

## 2021-03-30 DIAGNOSIS — H401132 Primary open-angle glaucoma, bilateral, moderate stage: Secondary | ICD-10-CM | POA: Diagnosis not present

## 2021-05-30 DIAGNOSIS — D1801 Hemangioma of skin and subcutaneous tissue: Secondary | ICD-10-CM | POA: Diagnosis not present

## 2021-05-30 DIAGNOSIS — L821 Other seborrheic keratosis: Secondary | ICD-10-CM | POA: Diagnosis not present

## 2021-05-30 DIAGNOSIS — L814 Other melanin hyperpigmentation: Secondary | ICD-10-CM | POA: Diagnosis not present

## 2021-05-30 DIAGNOSIS — L718 Other rosacea: Secondary | ICD-10-CM | POA: Diagnosis not present

## 2021-05-30 DIAGNOSIS — L218 Other seborrheic dermatitis: Secondary | ICD-10-CM | POA: Diagnosis not present

## 2021-05-30 DIAGNOSIS — L570 Actinic keratosis: Secondary | ICD-10-CM | POA: Diagnosis not present

## 2021-05-30 DIAGNOSIS — D235 Other benign neoplasm of skin of trunk: Secondary | ICD-10-CM | POA: Diagnosis not present

## 2021-05-30 DIAGNOSIS — D225 Melanocytic nevi of trunk: Secondary | ICD-10-CM | POA: Diagnosis not present

## 2021-07-07 ENCOUNTER — Other Ambulatory Visit: Payer: Self-pay | Admitting: Internal Medicine

## 2021-07-07 DIAGNOSIS — Z1231 Encounter for screening mammogram for malignant neoplasm of breast: Secondary | ICD-10-CM

## 2021-07-31 DIAGNOSIS — H524 Presbyopia: Secondary | ICD-10-CM | POA: Diagnosis not present

## 2021-07-31 DIAGNOSIS — H401131 Primary open-angle glaucoma, bilateral, mild stage: Secondary | ICD-10-CM | POA: Diagnosis not present

## 2021-08-04 DIAGNOSIS — H10411 Chronic giant papillary conjunctivitis, right eye: Secondary | ICD-10-CM | POA: Diagnosis not present

## 2021-08-18 ENCOUNTER — Ambulatory Visit: Payer: Medicare Other

## 2021-08-21 ENCOUNTER — Ambulatory Visit
Admission: RE | Admit: 2021-08-21 | Discharge: 2021-08-21 | Disposition: A | Discharge status: Home / Self Care | Payer: Medicare Other | Source: Ambulatory Visit | Attending: Internal Medicine | Admitting: Internal Medicine

## 2021-08-21 DIAGNOSIS — Z1231 Encounter for screening mammogram for malignant neoplasm of breast: Secondary | ICD-10-CM | POA: Diagnosis not present

## 2021-11-01 DIAGNOSIS — I10 Essential (primary) hypertension: Secondary | ICD-10-CM | POA: Diagnosis not present

## 2021-11-01 DIAGNOSIS — E039 Hypothyroidism, unspecified: Secondary | ICD-10-CM | POA: Diagnosis not present

## 2021-11-01 DIAGNOSIS — Z0000 Encounter for general adult medical examination without abnormal findings: Secondary | ICD-10-CM | POA: Diagnosis not present

## 2021-11-01 DIAGNOSIS — R001 Bradycardia, unspecified: Secondary | ICD-10-CM | POA: Diagnosis not present

## 2021-11-01 DIAGNOSIS — M5136 Other intervertebral disc degeneration, lumbar region: Secondary | ICD-10-CM | POA: Diagnosis not present

## 2021-11-01 DIAGNOSIS — M47816 Spondylosis without myelopathy or radiculopathy, lumbar region: Secondary | ICD-10-CM | POA: Diagnosis not present

## 2021-11-02 DIAGNOSIS — H00012 Hordeolum externum right lower eyelid: Secondary | ICD-10-CM | POA: Diagnosis not present

## 2021-11-02 DIAGNOSIS — H0100B Unspecified blepharitis left eye, upper and lower eyelids: Secondary | ICD-10-CM | POA: Diagnosis not present

## 2021-11-02 DIAGNOSIS — H0100A Unspecified blepharitis right eye, upper and lower eyelids: Secondary | ICD-10-CM | POA: Diagnosis not present

## 2021-11-02 DIAGNOSIS — H10501 Unspecified blepharoconjunctivitis, right eye: Secondary | ICD-10-CM | POA: Diagnosis not present

## 2021-11-15 DIAGNOSIS — I10 Essential (primary) hypertension: Secondary | ICD-10-CM | POA: Diagnosis not present

## 2021-11-27 ENCOUNTER — Other Ambulatory Visit: Payer: Self-pay | Admitting: Internal Medicine

## 2021-11-27 ENCOUNTER — Ambulatory Visit
Admission: RE | Admit: 2021-11-27 | Discharge: 2021-11-27 | Disposition: A | Discharge status: Home / Self Care | Payer: Medicare Other | Source: Ambulatory Visit | Attending: Internal Medicine | Admitting: Internal Medicine

## 2021-11-27 DIAGNOSIS — Z9049 Acquired absence of other specified parts of digestive tract: Secondary | ICD-10-CM | POA: Diagnosis not present

## 2021-11-27 DIAGNOSIS — M7989 Other specified soft tissue disorders: Secondary | ICD-10-CM | POA: Diagnosis not present

## 2021-11-27 DIAGNOSIS — I10 Essential (primary) hypertension: Secondary | ICD-10-CM | POA: Diagnosis not present

## 2021-11-27 DIAGNOSIS — M419 Scoliosis, unspecified: Secondary | ICD-10-CM | POA: Diagnosis not present

## 2021-11-27 DIAGNOSIS — M546 Pain in thoracic spine: Secondary | ICD-10-CM | POA: Diagnosis not present

## 2021-11-27 DIAGNOSIS — M40204 Unspecified kyphosis, thoracic region: Secondary | ICD-10-CM | POA: Diagnosis not present

## 2021-11-27 DIAGNOSIS — M25531 Pain in right wrist: Secondary | ICD-10-CM | POA: Diagnosis not present

## 2021-11-27 DIAGNOSIS — J9811 Atelectasis: Secondary | ICD-10-CM | POA: Diagnosis not present

## 2021-11-27 DIAGNOSIS — T1490XA Injury, unspecified, initial encounter: Secondary | ICD-10-CM

## 2021-12-27 ENCOUNTER — Ambulatory Visit
Admission: RE | Admit: 2021-12-27 | Discharge: 2021-12-27 | Disposition: A | Discharge status: Home / Self Care | Payer: Medicare Other | Source: Ambulatory Visit | Attending: Physician Assistant | Admitting: Physician Assistant

## 2021-12-27 ENCOUNTER — Other Ambulatory Visit: Payer: Self-pay | Admitting: Physician Assistant

## 2021-12-27 DIAGNOSIS — I10 Essential (primary) hypertension: Secondary | ICD-10-CM | POA: Diagnosis not present

## 2021-12-27 DIAGNOSIS — R059 Cough, unspecified: Secondary | ICD-10-CM | POA: Diagnosis not present

## 2021-12-27 DIAGNOSIS — R0989 Other specified symptoms and signs involving the circulatory and respiratory systems: Secondary | ICD-10-CM

## 2021-12-27 DIAGNOSIS — R051 Acute cough: Secondary | ICD-10-CM

## 2022-01-08 DIAGNOSIS — M47816 Spondylosis without myelopathy or radiculopathy, lumbar region: Secondary | ICD-10-CM | POA: Diagnosis not present

## 2022-01-10 DIAGNOSIS — M85852 Other specified disorders of bone density and structure, left thigh: Secondary | ICD-10-CM | POA: Diagnosis not present

## 2022-01-10 DIAGNOSIS — M85851 Other specified disorders of bone density and structure, right thigh: Secondary | ICD-10-CM | POA: Diagnosis not present

## 2022-01-10 DIAGNOSIS — Z780 Asymptomatic menopausal state: Secondary | ICD-10-CM | POA: Diagnosis not present

## 2022-01-23 DIAGNOSIS — M47816 Spondylosis without myelopathy or radiculopathy, lumbar region: Secondary | ICD-10-CM | POA: Diagnosis not present

## 2022-01-26 DIAGNOSIS — I10 Essential (primary) hypertension: Secondary | ICD-10-CM | POA: Diagnosis not present

## 2022-01-26 DIAGNOSIS — M8000XD Age-related osteoporosis with current pathological fracture, unspecified site, subsequent encounter for fracture with routine healing: Secondary | ICD-10-CM | POA: Diagnosis not present

## 2022-01-26 DIAGNOSIS — E559 Vitamin D deficiency, unspecified: Secondary | ICD-10-CM | POA: Diagnosis not present

## 2022-01-29 DIAGNOSIS — H401131 Primary open-angle glaucoma, bilateral, mild stage: Secondary | ICD-10-CM | POA: Diagnosis not present

## 2022-01-29 DIAGNOSIS — H2513 Age-related nuclear cataract, bilateral: Secondary | ICD-10-CM | POA: Diagnosis not present

## 2022-01-29 DIAGNOSIS — H35371 Puckering of macula, right eye: Secondary | ICD-10-CM | POA: Diagnosis not present

## 2022-03-29 DIAGNOSIS — H401132 Primary open-angle glaucoma, bilateral, moderate stage: Secondary | ICD-10-CM | POA: Diagnosis not present

## 2022-03-29 DIAGNOSIS — H353131 Nonexudative age-related macular degeneration, bilateral, early dry stage: Secondary | ICD-10-CM | POA: Diagnosis not present

## 2022-03-29 DIAGNOSIS — H2513 Age-related nuclear cataract, bilateral: Secondary | ICD-10-CM | POA: Diagnosis not present

## 2022-03-29 DIAGNOSIS — H43811 Vitreous degeneration, right eye: Secondary | ICD-10-CM | POA: Diagnosis not present

## 2022-03-29 DIAGNOSIS — H35371 Puckering of macula, right eye: Secondary | ICD-10-CM | POA: Diagnosis not present

## 2022-04-09 ENCOUNTER — Encounter (HOSPITAL_COMMUNITY): Payer: Medicare Other

## 2022-04-12 ENCOUNTER — Other Ambulatory Visit (HOSPITAL_COMMUNITY): Payer: Self-pay | Admitting: *Deleted

## 2022-04-12 DIAGNOSIS — M8000XA Age-related osteoporosis with current pathological fracture, unspecified site, initial encounter for fracture: Secondary | ICD-10-CM

## 2022-04-12 DIAGNOSIS — E222 Syndrome of inappropriate secretion of antidiuretic hormone: Secondary | ICD-10-CM

## 2022-04-13 ENCOUNTER — Ambulatory Visit (HOSPITAL_COMMUNITY)
Admission: RE | Admit: 2022-04-13 | Discharge: 2022-04-13 | Disposition: A | Discharge status: Home / Self Care | Payer: Medicare Other | Source: Ambulatory Visit | Attending: Internal Medicine | Admitting: Internal Medicine

## 2022-04-13 VITALS — BP 168/97 | HR 63 | Temp 97.6°F | Resp 18 | Wt 162.0 lb

## 2022-04-13 DIAGNOSIS — M810 Age-related osteoporosis without current pathological fracture: Secondary | ICD-10-CM | POA: Insufficient documentation

## 2022-04-13 LAB — CREATININE, SERUM
Creatinine, Ser: 0.78 mg/dL (ref 0.44–1.00)
GFR, Estimated: >60 mL/min (ref >60)

## 2022-04-13 LAB — CALCIUM: Calcium: 9 mg/dL (ref 8.9–10.3)

## 2022-04-13 MED ORDER — ZOLEDRONIC ACID 5 MG/100ML IV SOLN
INTRAVENOUS | Status: AC
  Administered 2022-04-13: 5 mg via INTRAVENOUS
  Filled 2022-04-13: qty 100

## 2022-04-13 MED ORDER — ZOLEDRONIC ACID 5 MG/100ML IV SOLN: 5.0000 mg | Freq: Once | INTRAVENOUS | Status: AC

## 2022-05-03 ENCOUNTER — Emergency Department (HOSPITAL_COMMUNITY)
Admission: EM | Admit: 2022-05-03 | Discharge: 2022-05-03 | Disposition: A | Discharge status: Home / Self Care | Payer: Medicare Other | Attending: Emergency Medicine | Admitting: Emergency Medicine

## 2022-05-03 ENCOUNTER — Emergency Department (HOSPITAL_COMMUNITY): Payer: Medicare Other

## 2022-05-03 DIAGNOSIS — Z79899 Other long term (current) drug therapy: Secondary | ICD-10-CM | POA: Insufficient documentation

## 2022-05-03 DIAGNOSIS — E039 Hypothyroidism, unspecified: Secondary | ICD-10-CM | POA: Diagnosis not present

## 2022-05-03 DIAGNOSIS — R030 Elevated blood-pressure reading, without diagnosis of hypertension: Secondary | ICD-10-CM

## 2022-05-03 DIAGNOSIS — D696 Thrombocytopenia, unspecified: Secondary | ICD-10-CM | POA: Insufficient documentation

## 2022-05-03 DIAGNOSIS — I700 Atherosclerosis of aorta: Secondary | ICD-10-CM | POA: Diagnosis not present

## 2022-05-03 DIAGNOSIS — E778 Other disorders of glycoprotein metabolism: Secondary | ICD-10-CM | POA: Diagnosis not present

## 2022-05-03 DIAGNOSIS — R7309 Other abnormal glucose: Secondary | ICD-10-CM | POA: Diagnosis not present

## 2022-05-03 DIAGNOSIS — R001 Bradycardia, unspecified: Secondary | ICD-10-CM | POA: Diagnosis not present

## 2022-05-03 DIAGNOSIS — I493 Ventricular premature depolarization: Secondary | ICD-10-CM

## 2022-05-03 DIAGNOSIS — Z7982 Long term (current) use of aspirin: Secondary | ICD-10-CM | POA: Insufficient documentation

## 2022-05-03 DIAGNOSIS — I10 Essential (primary) hypertension: Secondary | ICD-10-CM | POA: Insufficient documentation

## 2022-05-03 DIAGNOSIS — J9811 Atelectasis: Secondary | ICD-10-CM | POA: Diagnosis not present

## 2022-05-03 LAB — CBC WITH DIFFERENTIAL/PLATELET
Abs Immature Granulocytes: 0.02 10*3/uL (ref 0.00–0.07)
Basophils Absolute: 0 10*3/uL (ref 0.0–0.1)
Basophils Relative: 1 %
Eosinophils Absolute: 0.1 10*3/uL (ref 0.0–0.5)
Eosinophils Relative: 2 %
HCT: 34.9 % — ABNORMAL LOW (ref 36.0–46.0)
Hemoglobin: 11.7 g/dL — ABNORMAL LOW (ref 12.0–15.0)
Immature Granulocytes: 0 %
Lymphocytes Relative: 25 %
Lymphs Abs: 1.7 10*3/uL (ref 0.7–4.0)
MCH: 31.3 pg (ref 26.0–34.0)
MCHC: 33.5 g/dL (ref 30.0–36.0)
MCV: 93.3 fL (ref 80.0–100.0)
Monocytes Absolute: 0.5 10*3/uL (ref 0.1–1.0)
Monocytes Relative: 8 %
Neutro Abs: 4.3 10*3/uL (ref 1.7–7.7)
Neutrophils Relative %: 64 %
Platelets: 129 10*3/uL — ABNORMAL LOW (ref 150–400)
RBC: 3.74 MIL/uL — ABNORMAL LOW (ref 3.87–5.11)
RDW: 12.7 % (ref 11.5–15.5)
WBC: 6.6 10*3/uL (ref 4.0–10.5)
nRBC: 0 % (ref 0.0–0.2)

## 2022-05-03 LAB — COMPREHENSIVE METABOLIC PANEL
ALT: 14 U/L (ref 0–44)
AST: 19 U/L (ref 15–41)
Albumin: 3.2 g/dL — ABNORMAL LOW (ref 3.5–5.0)
Alkaline Phosphatase: 55 U/L (ref 38–126)
Anion gap: 7 (ref 5–15)
BUN: 16 mg/dL (ref 8–23)
CO2: 22 mmol/L (ref 22–32)
Calcium: 8.2 mg/dL — ABNORMAL LOW (ref 8.9–10.3)
Chloride: 111 mmol/L (ref 98–111)
Creatinine, Ser: 0.73 mg/dL (ref 0.44–1.00)
GFR, Estimated: >60 mL/min (ref >60)
Glucose, Bld: 101 mg/dL — ABNORMAL HIGH (ref 70–99)
Potassium: 3.7 mmol/L (ref 3.5–5.1)
Sodium: 140 mmol/L (ref 135–145)
Total Bilirubin: 1 mg/dL (ref 0.3–1.2)
Total Protein: 5.6 g/dL — ABNORMAL LOW (ref 6.5–8.1)

## 2022-05-03 LAB — TROPONIN I (HIGH SENSITIVITY): Troponin I (High Sensitivity): 11 ng/L (ref <18)

## 2022-05-03 MED ORDER — HYDRALAZINE HCL 50 MG PO TABS: 50.0000 mg | ORAL_TABLET | Freq: Once | ORAL | 0 refills | Status: DC | PRN

## 2022-05-03 MED ORDER — HYDRALAZINE HCL 25 MG PO TABS
50.0000 mg | ORAL_TABLET | Freq: Once | ORAL | Status: AC
  Administered 2022-05-03: 50 mg via ORAL
  Filled 2022-05-03: qty 2

## 2022-05-03 NOTE — ED Provider Notes (Signed)
[facility] Provider Note   CSN: [id number] Arrival date & time: 05/03/22  1903     History Chief Complaint  Patient presents with   Hypertension    Jenny [name] is a 82 y.o. female with h/o HTN, hypothyroidism, white coat syndrome, glaucoma presents to the ER for evaluation of her elevated blood pressure and low pulse rate today. The patient reports that she will occasionally check her blood pressure every other day, and sometimes multiple times a day. Today she reports that she took her BP this morning and it was 130s. She reports that she took her BP in the afternoon and it was A999333 systolic with a heartrate reading 42. The patient denies any chest pain, shortness of breath, dizziness, lightheadedness, trouble walking, trouble talking, palpitations, headache, vision changes. Daughter at bedside was concerned given the low heartrate as her dad recently passed from bradycardia tuned cardiac arrest and was anxious.      Hypertension Pertinent negatives include no chest pain, no abdominal pain, no headaches and no shortness of breath.       Home Medications Prior to Admission medications   Medication Sig Start Date End Date Taking? Authorizing Provider  amLODipine (NORVASC) 5 MG tablet Take 5 mg by mouth every morning.  05/02/15   [provider]  aspirin 81 MG tablet Take 81 mg by mouth every morning.     [provider]  dorzolamide-timolol (COSOPT) 22.3-6.8 MG/ML ophthalmic solution Place 1 drop into both eyes 2 (two) times daily. 05/02/15   [provider]  latanoprost (XALATAN) 0.005 % ophthalmic solution Place 1 drop into both eyes at bedtime. 05/03/15   [provider]  lisinopril (ZESTRIL) 10 MG tablet Take 20 mg by mouth daily.    [provider]  Multiple Vitamin (MULTIVITAMIN WITH MINERALS) TABS tablet Take 1 tablet by mouth daily.    [provider]  SYNTHROID 100 MCG  tablet Take 100 mcg by mouth daily before breakfast.  05/02/15   [provider]      Allergies    Patient has no known allergies.    Review of Systems   Review of Systems  Constitutional:  Negative for chills and fever.  Eyes:  Negative for visual disturbance.  Respiratory:  Negative for cough and shortness of breath.   Cardiovascular:  Negative for chest pain and palpitations.  Gastrointestinal:  Negative for abdominal pain, constipation, diarrhea, nausea and vomiting.  Genitourinary:  Negative for dysuria and hematuria.  Musculoskeletal:  Negative for gait problem.  Neurological:  Negative for dizziness, syncope, speech difficulty, weakness, light-headedness and headaches.    Physical Exam Updated Vital Signs BP (!) 163/114 (BP Location: Right Arm)   Pulse 82   Temp 98.3 F (36.8 C) (Oral)   Resp 20   Ht 5' 8"$  (1.727 m)   Wt 73.5 kg   SpO2 96%   BMI 24.64 kg/m  Physical Exam Vitals and nursing note reviewed.  Constitutional:      Appearance: Normal appearance.  HENT:     Head: Normocephalic and atraumatic.     Mouth/Throat:     Mouth: Mucous membranes are moist.  Eyes:     General: No scleral icterus.    Extraocular Movements: Extraocular movements intact.     Pupils: Pupils are equal, round, and reactive to light.  Cardiovascular:     Rate and Rhythm: Normal rate. Rhythm irregular.  Pulmonary:     Effort: Pulmonary effort is  normal. No respiratory distress.  Abdominal:     General: Bowel sounds are normal.     Palpations: Abdomen is soft.     Tenderness: There is no abdominal tenderness. There is no guarding or rebound.  Musculoskeletal:        General: No deformity.     Cervical back: Normal range of motion.  Skin:    General: Skin is warm and dry.  Neurological:     General: No focal deficit present.     Mental Status: She is alert. Mental status is at baseline.     GCS: GCS eye subscore is 4. GCS verbal subscore is 5. GCS motor subscore is 6.      Cranial Nerves: No cranial nerve deficit, dysarthria or facial asymmetry.     Sensory: No sensory deficit.     Motor: No weakness or pronator drift.     Comments: GCS 15.  Cranial nerves II through XII intact.  Patient is answering questions appropriately with appropriate speech.  No facial asymmetry noted.  Sensation intact throughout.  Strength is intact in patient's upper and lower bilateral extremities and is symmetric.  No pronator drift.     ED Results / Procedures / Treatments   Labs (all labs ordered are listed, but only abnormal results are displayed) Labs Reviewed  CBC WITH DIFFERENTIAL/PLATELET  COMPREHENSIVE METABOLIC PANEL  TROPONIN I (HIGH SENSITIVITY)    EKG EKG Interpretation  Date/Time:  Thursday May 03 2022 19:08:24 EST Ventricular Rate:  81 PR Interval:  147 QRS Duration: 85 QT Interval:  392 QTC Calculation: 455 R Axis:   -27 Text Interpretation: Sinus rhythm Ventricular trigeminy Probable left atrial enlargement Borderline left axis deviation RSR' in V1 or V2, probably normal variant Confirmed by Octaviano Glow [phone]) on 05/03/2022 7:49:27 PM  Radiology No results found.  Procedures Procedures   Medications Ordered in ED Medications - No data to display  ED Course/ Medical Decision Making/ A&P Clinical Course as of 05/03/22 2049  Thu May 03, 2022  2038 This is a pleasant 82 year old female presented emergency department with asymptomatic hypertension.  She noted her blood pressure was extremely elevated today when she was checking it, over A999333 systolic, which is quite unusual for her.  She is on lisinopril 20 mg daily.  She denies palpitations, lightheadedness, shortness of breath, chest pressure, loss of consciousness.  She does report a significant family history of MI and coronary disease in the early 60s, and has never seen a cardiologist.  Her workup is unremarkable today aside from some hypertension, which improved with oral hydralazine.   She does not have signs or symptoms of hypertensive emergency.  Her blood work was unremarkable.  EKG per my interpretation shows a sinus rhythm with PVC.  Her telemetry also showed intermittent PVCs and occasional ventricular trigeminy, but no high-grade arrhythmia.  I think is reasonable to discharge her at home to continue to monitor her blood pressure, prescribed a rescue medicine with hydralazine for persistently elevated blood pressure on repeat checks.  Ultimately she will follow-up with her PCPs office for her blood pressure, and we also offered a referral to cardiology as she has never had a cardiac screening, and is having intermittent trigeminy issues.  She and her daughter verbalized understanding.  I think they are reasonably stable for discharge at this time [MT]    Clinical Course User Index [MT] Trifan, Carola Rhine, MD   Medical Decision Making Amount and/or Complexity of Data Reviewed Labs: ordered. Radiology: ordered.  Risk Prescription drug management.   82 year old female presents emerged department today for evaluation of elevated blood pressure and low heart rate.  Differential diagnosis includes was limited to cardiac arrhythmia, hypertensive urgency, hypertensive emergency, ACS, stroke.  Vital signs show blood pressure 163/114, afebrile, normal pulse rate, satting well room air without increased work of breathing.  Physical exam as noted above.  While in the room on cardiac monitor, patient does have frequent PVCs.  Will have sustained bigeminy for 10-second run and then will only have occasional PVCs after that.  I independently reviewed and interpreted the patient's labs.  CBC shows some mild anemia with a hemoglobin of 11.7.  Mild thrombocytopenia with platelets of 129.  Only recent labs I have are from over a year ago.  It appears the patient's hemoglobin is usually around 13.3-13.8.  Her last platelet counts are at 158.  CMP shows mildly elevated glucose at 101.  Mildly  decreased calcium, protein, and albumin.  Otherwise, no electrolyte or LFT abnormality..  Troponin shows 11, she does not have any chest pain, I do not think a repeat troponin is needed at this time.  EKG reviewed and interpreted by my attending and read as Sinus rhythm Ventricular trigeminy Probable left atrial enlargement Borderline left axis deviation RSR' in V1 or V2, probably normal variant.  Chest x-ray shows no acute cardiopulmonary process.  The patient does not have any signs or symptoms of a hypertensive emergency.  Her neurological exam is unremarkable.  Her lab work is mostly unremarkable.  She was given hydralazine here for her blood pressure.  Last one being 157/90. The patient does have occasional bigeminy, but is not sustained. She is also asymptomatic. Will have her follow up with her PCP about her elevated blood pressure. Will send her home with a few hydralazine pills to take if her BP is over 200. Additionally, I sent an ambulatory referral in for cardiology. I reviewed the labs, imaging, and plan with patient and family at bedside. We discussed return precautions and red flag symptoms.  They verbalized their understanding and agreed to the plan.  Patient is stable and being discharged home.  I discussed this case with my attending physician who cosigned this note including patient's presenting symptoms, physical exam, and planned diagnostics and interventions. Attending physician stated agreement with plan or made changes to plan which were implemented.   Attending physician assessed patient at bedside.  Final Clinical Impression(s) / ED Diagnoses Final diagnoses:  Elevated blood pressure reading  Frequent PVCs    Rx / DC Orders ED Discharge Orders          Ordered    hydrALAZINE (APRESOLINE) 50 MG tablet  Once PRN        05/03/22 2030    Ambulatory referral to Cardiology       Comments: If you have not heard from the Cardiology office within the next 72 hours please  call [phone].   05/03/22 2030               Sherrell Puller, PA-C 05/03/22 2101    Wyvonnia Dusky, MD 05/03/22 2215

## 2022-05-03 NOTE — Discharge Instructions (Addendum)
You were seen in the ER for evaluation of your elevated blood pressure.  It was seen that you have some occasional irregular heartbeats.  Because of this, I would like for you to follow-up with a cardiologist.  I have put in an ambulatory referral for you to see them however if you do not hear back for the next few days, please call to schedule an appointment.  Additionally, I am sending you home with a few tablets of a medication called hydralazine to help with your blood pressure.  Only take this medication if the top number of your blood pressures over 200 after rechecking it.  I have included a blood pressure log into this discharge paperwork for you to record your blood pressures to bring to your cardiologist and primary care office.  If you have any concerns, new or worsening symptoms, please return to the nearest emergency department for evaluation.  Contact a health care provider if you: Think you are having a reaction to a medicine you are taking. Have headaches that keep coming back (recurring). Feel dizzy. Have swelling in your ankles. Have trouble with your vision. Get help right away if you: Develop a severe headache or confusion. Have unusual weakness or numbness. Feel faint. Have severe pain in your chest or abdomen. Vomit repeatedly. Have trouble breathing. These symptoms may be an emergency. Get help right away. Call 911. Do not wait to see if the symptoms will go away. Do not drive yourself to the hospital.

## 2022-05-03 NOTE — ED Triage Notes (Signed)
Pt BIB EMS from home for hypertension, checks her BP twice a day and most recent one was over A999333 systolic. No other symptoms, not c/o cx pain, SOB. EMS noticed bigeminy and BP changes with those episodes. Aox4.

## 2022-05-31 ENCOUNTER — Encounter: Payer: Self-pay | Admitting: Cardiovascular Disease

## 2022-05-31 NOTE — Progress Notes (Unsigned)
Cardiology Office Note:    Date:  06/02/2022   ID:  [name], [name] 10/21/1940, MRN [id number]  PCP:  Charlane Ferretti, MD   [facility] Cardiologist: Joesph Marcy  1}    Referring MD: Lavone Orn, MD   Chief Complaint  Patient presents with   Hypertension         History of Present Illness:    Jenny [name] is a 82 y.o. female with a hx of HTN, hypothyridism.  Seen with   daughter, Geraldine Contras to the ER for HTN and slow HR  EF eval showed frequent PVCs ER gave her hydralazine 50 mg to be used PRN   No cp, no dyspnea  No dizziness  Has white coat HTN  Rides stationary bike for 30 min a day  Does her house work  No syncope  No head aches   Avoids salt  Does not eat processed meats  Mother and father died with heart problems    Past Medical History:  Diagnosis Date   Breast cancer ([place])    Cancer ([place]) 19 YRS    LEFT BREAST   History of kidney stones LAST FEB 2017   Hypertension    Hypothyroidism    Personal history of radiation therapy     Past Surgical History:  Procedure Laterality Date   ABDOMINAL HYSTERECTOMY     PARTIAL   BREAST BIOPSY     BREAST LUMPECTOMY Left    1998   CHOLECYSTECTOMY     COLONOSCOPY WITH PROPOFOL N/A 09/06/2015   Procedure: COLONOSCOPY WITH PROPOFOL;  Surgeon: Garlan Fair, MD;  Location: [facility] END[facility]COPY;  Service: Endoscopy;  Laterality: N/A;   CYSTOCELE REPAIR N/A 11/15/2020   Procedure: ANTERIOR REPAIR (CYSTOCELE) / CYSTOSCOPY;  Surgeon: Bjorn Loser, MD;  Location: [facility] ORS;  Service: Urology;  Laterality: N/A;  REQUESTING 2.5 HRS   LEFT BREAST LUMPECTOMY  18 YRS AGO   RADIATION DONE   STENT PLACE LEFT URETER ([facility] HX)  9 YRS AGO    Current Medications: Current Meds  Medication Sig   bisoprolol (ZEBETA) 5 MG tablet Take 0.5 tablets (2.5 mg total) by mouth daily.   dorzolamide-timolol (COSOPT) 22.3-6.8 MG/ML ophthalmic solution Place 1 drop into both eyes 2 (two) times daily.    hydrALAZINE (APRESOLINE) 50 MG tablet Take 50 mg by mouth as needed.   latanoprost (XALATAN) 0.005 % ophthalmic solution Place 1 drop into both eyes at bedtime.   Multiple Vitamin (MULTIVITAMIN WITH MINERALS) TABS tablet Take 1 tablet by mouth daily.   SYNTHROID 100 MCG tablet Take 100 mcg by mouth daily before breakfast.    valsartan (DIOVAN) 160 MG tablet Take 1 tablet (160 mg total) by mouth daily.   [DISCONTINUED] amLODipine (NORVASC) 5 MG tablet Take 5 mg by mouth every morning.    [DISCONTINUED] aspirin 81 MG tablet Take 81 mg by mouth every morning.    [DISCONTINUED] hydrALAZINE (APRESOLINE) 50 MG tablet Take 1 tablet (50 mg total) by mouth once as needed for up to 1 dose. Take if your systolic blood pressure is over 200 after re-checking.   [DISCONTINUED] lisinopril (ZESTRIL) 10 MG tablet Take 20 mg by mouth daily.     Allergies:   Patient has no known allergies.   Social History   Socioeconomic History   Marital status: Married    Spouse name: Not on file   Number of children: Not on file   Years of education: Not on file  Highest education level: Not on file  Occupational History   Not on file  Tobacco Use   Smoking status: Never   Smokeless tobacco: Never  Vaping Use   Vaping Use: Never used  Substance and Sexual Activity   Alcohol use: No   Drug use: No   Sexual activity: Not on file  Other Topics Concern   Not on file  Social History Narrative   Not on file   Social Determinants of Health   Financial Resource Strain: Not on file  Food Insecurity: Not on file  Transportation Needs: Not on file  Physical Activity: Not on file  Stress: Not on file  Social Connections: Not on file     Family History: The patient's family history includes Breast cancer in her sister.  ROS:   Please see the history of present illness.     All other systems reviewed and are negative.  EKGs/Labs/Other Studies Reviewed:    The following studies were reviewed  today:   EKG:  Feb. 22, 2024 NSR at 81, occasional PVCs   Recent Labs: 05/03/2022: ALT 14; BUN 16; Creatinine, Ser 0.73; Hemoglobin 11.7; Platelets 129; Potassium 3.7; Sodium 140  Recent Lipid Panel No results found for: "CHOL", "TRIG", "HDL", "CHOLHDL", "VLDL", "LDLCALC", "LDLDIRECT"   Risk Assessment/Calculations:      HYPERTENSION CONTROL Vitals:   06/01/22 1437 06/01/22 1455  BP: (!) 140/100 (!) 150/90    The patient's blood pressure is elevated above target today.  In order to address the patient's elevated BP: Blood pressure will be monitored at home to determine if medication changes need to be made.; A new medication was prescribed today.            Physical Exam:    VS:  BP (!) 150/90   Pulse 64   Ht 5\' 4"  (1.626 m)   Wt 166 lb 6.4 oz (75.5 kg)   SpO2 98%   BMI 28.56 kg/m     Wt Readings from Last 3 Encounters:  06/01/22 166 lb 6.4 oz (75.5 kg)  05/03/22 162 lb 0.6 oz (73.5 kg)  04/13/22 162 lb (73.5 kg)     GEN:  elderly female  in no acute distress HEENT: Normal NECK: No JVD; No carotid bruits LYMPHATICS: No lymphadenopathy CARDIAC: RR with frequent premature beats ,  soft systolic murmur  RESPIRATORY:  Clear to auscultation without rales, wheezing or rhonchi  ABDOMEN: Soft, non-tender, non-distended MUSCULOSKELETAL:  No edema; No deformity  SKIN: Warm and dry NEUROLOGIC:  Alert and oriented x 3 PSYCHIATRIC:  Normal affect   ASSESSMENT:    1. Hypertension, unspecified type   2. Palpitations   3. Murmur    PLAN:    In order of problems listed above:  Hypertension: Bo presents with hypertension.  She has severe whitecoat hypertension so that your readings in the office are always much higher than her readings at home.  She is having frequent premature ventricular contractions.  I would like to discontinue the lisinopril.  Will start her on valsartan 160 mg a day.  I would also like to add bisoprolol 2.5 mg a day which should help her  hypertension and her PVCs.  She will keep a blood pressure log and bring it back when she sees [place] again in 6 to 8 weeks.  I would also like for her to bring her blood pressure cuff so we can compare her cuff with what are cuff status.  2.  Heart murmur: She has a  systolic heart murmur.  She also has premature ventricular contractions.  Will get an echocardiogram for further evaluation of her PVC and heart murmur.   3.  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  Will start her on low-dose bisoprolol.  If she continues to have PVCs we will consider an event monitor to assess what her PVC burden is.           Medication Adjustments/Labs and Tests Ordered: Current medicines are reviewed at length with the patient today.  Concerns regarding medicines are outlined above.  Orders Placed This Encounter  Procedures   Basic metabolic panel   ECHOCARDIOGRAM COMPLETE   Meds ordered this encounter  Medications   valsartan (DIOVAN) 160 MG tablet    Sig: Take 1 tablet (160 mg total) by mouth daily.    Dispense:  90 tablet    Refill:  3   bisoprolol (ZEBETA) 5 MG tablet    Sig: Take 0.5 tablets (2.5 mg total) by mouth daily.    Dispense:  45 tablet    Refill:  3    Patient Instructions  Medication Instructions:  STOP Lisinopril START Valsartan 160mg  once daily START Bisoprolol 2.5mg  once daily  *If you need a refill on your cardiac medications before your next appointment, please call your pharmacy*   Lab Work: BMET at next visit  If you have labs (blood work) drawn today and your tests are completely normal, you will receive your results only by: [facility] (if you have MyChart) OR A paper copy in the mail If you have any lab test that is abnormal or we need to change your treatment, we will call you to review the results.   Testing/Procedures: Echocardiogram Your physician has requested that you have an echocardiogram.  Echocardiography is a painless test that uses sound waves to create images of your heart. It provides your doctor with information about the size and shape of your heart and how well your heart's chambers and valves are working. This procedure takes approximately one hour. There are no restrictions for this procedure. Please do NOT wear cologne, perfume, aftershave, or lotions (deodorant is allowed). Please arrive 15 minutes prior to your appointment time.    Follow-Up: At [facility], you and your health needs are our priority.  As part of our continuing mission to provide you with exceptional heart care, we have created designated Provider Care Teams.  These Care Teams include your primary Cardiologist (physician) and Advanced Practice Providers (APPs -  Physician Assistants and Nurse Practitioners) who all work together to provide you with the care you need, when you need it.  We recommend signing up for the patient portal called "MyChart".  Sign up information is provided on this After Visit Summary.  MyChart is used to connect with patients for Virtual Visits (Telemedicine).  Patients are able to view lab/test results, encounter notes, upcoming appointments, etc.  Non-urgent messages can be sent to your provider as well.   To learn more about what you can do with MyChart, go to [email or web address].    Your next appointment:   8 week(s)  Provider:   Christen Bame, NP       Bring blood pressure monitor at next office visit    Signed, Mertie Moores, MD  06/02/2022 8:16 AM    [place]

## 2022-06-01 ENCOUNTER — Ambulatory Visit: Payer: Medicare Other | Attending: Cardiovascular Disease | Admitting: Cardiovascular Disease

## 2022-06-01 ENCOUNTER — Encounter: Payer: Self-pay | Admitting: Cardiovascular Disease

## 2022-06-01 VITALS — BP 150/90 | HR 64 | Ht 64.0 in | Wt 166.4 lb

## 2022-06-01 DIAGNOSIS — R011 Cardiac murmur, unspecified: Secondary | ICD-10-CM

## 2022-06-01 DIAGNOSIS — I10 Essential (primary) hypertension: Secondary | ICD-10-CM | POA: Diagnosis not present

## 2022-06-01 DIAGNOSIS — R002 Palpitations: Secondary | ICD-10-CM

## 2022-06-01 MED ORDER — BISOPROLOL FUMARATE 5 MG PO TABS: 2.5000 mg | ORAL_TABLET | Freq: Every day | ORAL | 3 refills | Status: DC

## 2022-06-01 MED ORDER — VALSARTAN 160 MG PO TABS: 160.0000 mg | ORAL_TABLET | Freq: Every day | ORAL | 3 refills | Status: DC

## 2022-06-01 NOTE — Patient Instructions (Signed)
Medication Instructions:  STOP Lisinopril START Valsartan 160mg  once daily START Bisoprolol 2.5mg  once daily  *If you need a refill on your cardiac medications before your next appointment, please call your pharmacy*   Lab Work: BMET at next visit  If you have labs (blood work) drawn today and your tests are completely normal, you will receive your results only by: [place] (if you have MyChart) OR A paper copy in the mail If you have any lab test that is abnormal or we need to change your treatment, we will call you to review the results.   Testing/Procedures: Echocardiogram Your physician has requested that you have an echocardiogram. Echocardiography is a painless test that uses sound waves to create images of your heart. It provides your doctor with information about the size and shape of your heart and how well your heart's chambers and valves are working. This procedure takes approximately one hour. There are no restrictions for this procedure. Please do NOT wear cologne, perfume, aftershave, or lotions (deodorant is allowed). Please arrive 15 minutes prior to your appointment time.    Follow-Up: At [facility], you and your health needs are our priority.  As part of our continuing mission to provide you with exceptional heart care, we have created designated Provider Care Teams.  These Care Teams include your primary Cardiologist (physician) and Advanced Practice Providers (APPs -  Physician Assistants and Nurse Practitioners) who all work together to provide you with the care you need, when you need it.  We recommend signing up for the patient portal called "MyChart".  Sign up information is provided on this After Visit Summary.  MyChart is used to connect with patients for Virtual Visits (Telemedicine).  Patients are able to view lab/test results, encounter notes, upcoming appointments, etc.  Non-urgent messages can be sent to your provider as well.   To learn more  about what you can do with MyChart, go to [email or web address].    Your next appointment:   8 week(s)  Provider:   Christen Bame, NP       Bring blood pressure monitor at next office visit

## 2022-06-29 ENCOUNTER — Ambulatory Visit (HOSPITAL_COMMUNITY): Payer: Medicare Other | Attending: Cardiology

## 2022-06-29 DIAGNOSIS — R011 Cardiac murmur, unspecified: Secondary | ICD-10-CM

## 2022-06-29 DIAGNOSIS — I10 Essential (primary) hypertension: Secondary | ICD-10-CM | POA: Diagnosis not present

## 2022-06-29 DIAGNOSIS — R002 Palpitations: Secondary | ICD-10-CM

## 2022-06-29 LAB — ECHOCARDIOGRAM COMPLETE
Area-P 1/2: 3.99 cm2
S' Lateral: 2.7 cm

## 2022-07-17 ENCOUNTER — Other Ambulatory Visit: Payer: Self-pay | Admitting: Internal Medicine

## 2022-07-17 DIAGNOSIS — Z1231 Encounter for screening mammogram for malignant neoplasm of breast: Secondary | ICD-10-CM

## 2022-07-18 DIAGNOSIS — D235 Other benign neoplasm of skin of trunk: Secondary | ICD-10-CM | POA: Diagnosis not present

## 2022-07-18 DIAGNOSIS — L821 Other seborrheic keratosis: Secondary | ICD-10-CM | POA: Diagnosis not present

## 2022-07-18 DIAGNOSIS — D1801 Hemangioma of skin and subcutaneous tissue: Secondary | ICD-10-CM | POA: Diagnosis not present

## 2022-07-18 DIAGNOSIS — L570 Actinic keratosis: Secondary | ICD-10-CM | POA: Diagnosis not present

## 2022-07-18 DIAGNOSIS — L218 Other seborrheic dermatitis: Secondary | ICD-10-CM | POA: Diagnosis not present

## 2022-07-18 DIAGNOSIS — B078 Other viral warts: Secondary | ICD-10-CM | POA: Diagnosis not present

## 2022-07-18 DIAGNOSIS — D225 Melanocytic nevi of trunk: Secondary | ICD-10-CM | POA: Diagnosis not present

## 2022-07-18 NOTE — Progress Notes (Unsigned)
Cardiology Office Note:    Date:  07/19/2022   ID:  Jenny [name], DOB 07/17/1940, MRN [id number]  PCP:  Jenny Ates, MD   [facility] Providers Cardiologist:  Jenny Miss, MD     Referring MD: Jenny Ates, MD   Chief Complaint: hypertension and frequent PVCs   History of Present Illness:    Jenny [name] is a very pleasant 82 y.o. female with a hx of hypertension, hypothyroidism, family history of heart disease, and breast cancer.   She was referred to cardiology and seen by Dr. Elease [name] on 06/01/2022.  She was seen with her daughter Jenny [name].  She went to the ER on 05/03/2022 for hypertension and slow heart rate.  EKG showed frequent PVCs.  She was given hydralazine 50 mg to be used as needed.  She reported history of whitecoat hypertension.  No chest pain, dyspnea, dizziness, syncope. Ride stationary bike for 30 minutes a day and does housework.  BP was elevated during office visit felt to be secondary to whitecoat hypertension.  Her lisinopril was discontinued and she was started on valsartan 160 mg daily.  Bisoprolol 2.5 mg daily was also added for frequent PVCs and hypertension.  She was advised to keep a blood pressure log at home and return in 8 weeks for follow-up.  She was encouraged to bring her home BP monitor.  A systolic murmur was noted and echo was ordered. Echo 06/29/2022 revealed normal LV systolic function with EF 60 to 65%, G1 DD, mild MR.  Today, she is here for follow-up of hypertension and frequent PVCs. Her daughter is with her. She reports she is feeling well and has no symptoms of chest discomfort including palpitations or flutters. Home BP has been well controlled, she has been tracking it consistently. Home BP generally 120s over 70s, HR 46-59 bpm.  She denies fatigue, lightheadedness, presyncope, syncope.  Remains active around her home. No shortness of breath, orthopnea, PND, or edema.    Past Medical History:  Diagnosis Date   Breast cancer (HCC)     Cancer (HCC) 18 YRS    LEFT BREAST   History of kidney stones LAST FEB 2017   Hypertension    Hypothyroidism    Personal history of radiation therapy     Past Surgical History:  Procedure Laterality Date   ABDOMINAL HYSTERECTOMY     PARTIAL   BREAST BIOPSY     BREAST LUMPECTOMY Left    1998   CHOLECYSTECTOMY     COLONOSCOPY WITH PROPOFOL N/A 09/06/2015   Procedure: COLONOSCOPY WITH PROPOFOL;  Surgeon: Jenny Bumpers, MD;  Location: [facility];  Service: Endoscopy;  Laterality: N/A;   CYSTOCELE REPAIR N/A 11/15/2020   Procedure: ANTERIOR REPAIR (CYSTOCELE) / CYSTOSCOPY;  Surgeon: Jenny Martinez, MD;  Location: [facility];  Service: Urology;  Laterality: N/A;  REQUESTING 2.5 HRS   LEFT BREAST LUMPECTOMY  18 YRS AGO   RADIATION DONE   STENT PLACE LEFT URETER ([facility] HX)  9 YRS AGO    Current Medications: Current Meds  Medication Sig   bisoprolol (ZEBETA) 5 MG tablet Take 0.5 tablets (2.5 mg total) by mouth daily.   dorzolamide-timolol (COSOPT) 22.3-6.8 MG/ML ophthalmic solution Place 1 drop into both eyes 2 (two) times daily.   hydrALAZINE (APRESOLINE) 50 MG tablet Take 50 mg by mouth as needed.   latanoprost (XALATAN) 0.005 % ophthalmic solution Place 1 drop into both eyes at bedtime.   Multiple Vitamin (MULTIVITAMIN WITH MINERALS) TABS tablet  Take 1 tablet by mouth daily.   SYNTHROID 100 MCG tablet Take 100 mcg by mouth daily before breakfast.    valsartan (DIOVAN) 160 MG tablet Take 1 tablet (160 mg total) by mouth daily.     Allergies:   Patient has no known allergies.   Social History   Socioeconomic History   Marital status: Married    Spouse name: Not on file   Number of children: Not on file   Years of education: Not on file   Highest education level: Not on file  Occupational History   Not on file  Tobacco Use   Smoking status: Never   Smokeless tobacco: Never  Vaping Use   Vaping Use: Never used  Substance and Sexual Activity   Alcohol use: No   Drug use:  No   Sexual activity: Not on file  Other Topics Concern   Not on file  Social History Narrative   Not on file   Social Determinants of Health   Financial Resource Strain: Not on file  Food Insecurity: Not on file  Transportation Needs: Not on file  Physical Activity: Not on file  Stress: Not on file  Social Connections: Not on file     Family History: The patient's family history includes Breast cancer in her sister.  ROS:   Please see the history of present illness.   All other systems reviewed and are negative.  Labs/Other Studies Reviewed:    The following studies were reviewed today:  Echo 06/29/22  1. Left ventricular ejection fraction, by estimation, is 60 to 65%. The  left ventricle has normal function. The left ventricle has no regional  wall motion abnormalities. Left ventricular diastolic parameters are  consistent with Grade I diastolic  dysfunction (impaired relaxation).   2. Right ventricular systolic function is normal. The right ventricular  size is normal. There is mildly elevated pulmonary artery systolic  pressure. The estimated right ventricular systolic pressure is 40.0 mmHg.   3. Right atrial size was moderately dilated.   4. The mitral valve is normal in structure. Mild mitral valve  regurgitation. No evidence of mitral stenosis.   5. The aortic valve is normal in structure. Aortic valve regurgitation is  not visualized. No aortic stenosis is present.   6. The inferior vena cava is normal in size with greater than 50%  respiratory variability, suggesting right atrial pressure of 3 mmHg.    Recent Labs: 05/03/2022: ALT 14; BUN 16; Creatinine, Ser 0.73; Hemoglobin 11.7; Platelets 129; Potassium 3.7; Sodium 140  Recent Lipid Panel No results found for: "CHOL", "TRIG", "HDL", "CHOLHDL", "VLDL", "LDLCALC", "LDLDIRECT"   Risk Assessment/Calculations:           Physical Exam:    VS:  BP (!) 160/80   Pulse 61   Ht 5\' 4"  (1.626 m)   Wt 165 lb  12.8 oz (75.2 kg)   SpO2 93%   BMI 28.46 kg/m     Wt Readings from Last 3 Encounters:  07/19/22 165 lb 12.8 oz (75.2 kg)  06/01/22 166 lb 6.4 oz (75.5 kg)  05/03/22 162 lb 0.6 oz (73.5 kg)     GEN:  Well nourished, well developed in no acute distress HEENT: Normal NECK: No JVD; No carotid bruits CARDIAC: RRR, no murmurs, rubs, gallops RESPIRATORY:  Clear to auscultation without rales, wheezing or rhonchi  ABDOMEN: Soft, non-tender, non-distended MUSCULOSKELETAL:  No edema; No deformity. 2+ pedal pulses, equal bilaterally SKIN: Warm and dry NEUROLOGIC:  Alert and  oriented x 3 PSYCHIATRIC:  Normal affect   EKG:  EKG is not ordered today.    HYPERTENSION CONTROL Vitals:   07/19/22 0851 07/19/22 1530  BP: (!) 180/94 (!) 160/80    The patient's blood pressure is elevated above target today.  In order to address the patient's elevated BP: The blood pressure is usually elevated in clinic.  Blood pressures monitored at home have been optimal.       Diagnoses:    1. Hypertension, unspecified type   2. Premature atrial contractions   3. Hyperlipidemia, unspecified hyperlipidemia type   4. Nonrheumatic mitral valve regurgitation   5. White coat syndrome with diagnosis of hypertension    Assessment and Plan:     Hypertension/White coat hypertension: BP elevated in the clinic and remains so on my recheck. Home BP is very well controlled. She is feeling well on current regimen. Will check BMP today to ensure stable renal function on valsartan.   PVCs: She is overall unaware of these and asymptomatic. She is tolerating bisoprolol without problem.   Hyperlipidemia: LDL 111 on 09/28/2019. No recent lipid panel to her awareness. She is agreeable for [place] to recheck today and consider statin medication if LDL remains elevated.  Mitral regurgitation: Mild mitral regurgitation with moderately dilated RA, mildly elevated pulmonary artery systolic pressure normal LVEF on echo 06/29/2022.   She is asymptomatic.  We will continue to monitor clinically at this time.   Disposition: 6 months with me  Medication Adjustments/Labs and Tests Ordered: Current medicines are reviewed at length with the patient today.  Concerns regarding medicines are outlined above.  Orders Placed This Encounter  Procedures   Lipid Profile   Basic Metabolic Panel (BMET)   No orders of the defined types were placed in this encounter.   Patient Instructions  Medication Instructions:   Your physician recommends that you continue on your current medications as directed. Please refer to the Current Medication list given to you today.   *If you need a refill on your cardiac medications before your next appointment, please call your pharmacy*   Lab Work:  TODAY!!!! BMET/LIPID  If you have labs (blood work) drawn today and your tests are completely normal, you will receive your results only by: MyChart Message (if you have MyChart) OR A paper copy in the mail If you have any lab test that is abnormal or we need to change your treatment, we will call you to review the results.   Testing/Procedures:  None ordered.   Follow-Up: At [facility], you and your health needs are our priority.  As part of our continuing mission to provide you with exceptional heart care, we have created designated Provider Care Teams.  These Care Teams include your primary Cardiologist (physician) and Advanced Practice Providers (APPs -  Physician Assistants and Nurse Practitioners) who all work together to provide you with the care you need, when you need it.  We recommend signing up for the patient portal called "MyChart".  Sign up information is provided on this After Visit Summary.  MyChart is used to connect with patients for Virtual Visits (Telemedicine).  Patients are able to view lab/test results, encounter notes, upcoming appointments, etc.  Non-urgent messages can be sent to your provider as well.   To  learn more about what you can do with MyChart, go to [email or web address].    Your next appointment:   5 month(s)  Provider:   Eligha Bridegroom, NP  Signed, Levi Aland, NP  07/19/2022 3:38 PM    [facility]

## 2022-07-19 ENCOUNTER — Ambulatory Visit: Payer: Medicare Other | Attending: Nurse Practitioner | Admitting: Nurse Practitioner

## 2022-07-19 ENCOUNTER — Encounter: Payer: Self-pay | Admitting: Nurse Practitioner

## 2022-07-19 ENCOUNTER — Ambulatory Visit: Payer: Medicare Other

## 2022-07-19 VITALS — BP 160/80 | HR 61 | Ht 64.0 in | Wt 165.8 lb

## 2022-07-19 DIAGNOSIS — I491 Atrial premature depolarization: Secondary | ICD-10-CM | POA: Diagnosis not present

## 2022-07-19 DIAGNOSIS — I10 Essential (primary) hypertension: Secondary | ICD-10-CM | POA: Diagnosis not present

## 2022-07-19 DIAGNOSIS — E785 Hyperlipidemia, unspecified: Secondary | ICD-10-CM | POA: Diagnosis not present

## 2022-07-19 DIAGNOSIS — I340 Nonrheumatic mitral (valve) insufficiency: Secondary | ICD-10-CM

## 2022-07-19 DIAGNOSIS — R011 Cardiac murmur, unspecified: Secondary | ICD-10-CM | POA: Diagnosis not present

## 2022-07-19 DIAGNOSIS — R002 Palpitations: Secondary | ICD-10-CM | POA: Diagnosis not present

## 2022-07-19 LAB — BASIC METABOLIC PANEL
BUN/Creatinine Ratio: 21 (ref 12–28)
BUN: 16 mg/dL (ref 8–27)
CO2: 25 mmol/L (ref 20–29)
Calcium: 9.1 mg/dL (ref 8.7–10.3)
Chloride: 105 mmol/L (ref 96–106)
Creatinine, Ser: 0.77 mg/dL (ref 0.57–1.00)
Glucose: 80 mg/dL (ref 70–99)
Potassium: 4.8 mmol/L (ref 3.5–5.2)
Sodium: 140 mmol/L (ref 134–144)
eGFR: 78 mL/min/{1.73_m2} (ref >59)

## 2022-07-19 LAB — LIPID PANEL
Chol/HDL Ratio: 3.3 ratio (ref 0.0–4.4)
Cholesterol, Total: 169 mg/dL (ref 100–199)
HDL: 52 mg/dL (ref >39)
LDL Chol Calc (NIH): 101 mg/dL — ABNORMAL HIGH (ref 0–99)
Triglycerides: 89 mg/dL (ref 0–149)
VLDL Cholesterol Cal: 16 mg/dL (ref 5–40)

## 2022-07-19 NOTE — Patient Instructions (Signed)
Medication Instructions:   Your physician recommends that you continue on your current medications as directed. Please refer to the Current Medication list given to you today.   *If you need a refill on your cardiac medications before your next appointment, please call your pharmacy*   Lab Work:  TODAY!!!! BMET/LIPID  If you have labs (blood work) drawn today and your tests are completely normal, you will receive your results only by: MyChart Message (if you have MyChart) OR A paper copy in the mail If you have any lab test that is abnormal or we need to change your treatment, we will call you to review the results.   Testing/Procedures:  None ordered.   Follow-Up: At [facility], you and your health needs are our priority.  As part of our continuing mission to provide you with exceptional heart care, we have created designated Provider Care Teams.  These Care Teams include your primary Cardiologist (physician) and Advanced Practice Providers (APPs -  Physician Assistants and Nurse Practitioners) who all work together to provide you with the care you need, when you need it.  We recommend signing up for the patient portal called "MyChart".  Sign up information is provided on this After Visit Summary.  MyChart is used to connect with patients for Virtual Visits (Telemedicine).  Patients are able to view lab/test results, encounter notes, upcoming appointments, etc.  Non-urgent messages can be sent to your provider as well.   To learn more about what you can do with MyChart, go to [email or web address].    Your next appointment:   5 month(s)  Provider:   Eligha Bridegroom, NP

## 2022-07-20 ENCOUNTER — Other Ambulatory Visit: Payer: Self-pay | Admitting: *Deleted

## 2022-07-20 DIAGNOSIS — E785 Hyperlipidemia, unspecified: Secondary | ICD-10-CM

## 2022-07-20 DIAGNOSIS — Z79899 Other long term (current) drug therapy: Secondary | ICD-10-CM

## 2022-07-20 MED ORDER — ROSUVASTATIN CALCIUM 10 MG PO TABS: 10.0000 mg | ORAL_TABLET | Freq: Every evening | ORAL | 6 refills | Status: DC

## 2022-07-23 DIAGNOSIS — J069 Acute upper respiratory infection, unspecified: Secondary | ICD-10-CM | POA: Diagnosis not present

## 2022-07-23 DIAGNOSIS — R059 Cough, unspecified: Secondary | ICD-10-CM | POA: Diagnosis not present

## 2022-07-23 DIAGNOSIS — G9331 Postviral fatigue syndrome: Secondary | ICD-10-CM | POA: Diagnosis not present

## 2022-07-23 DIAGNOSIS — Z03818 Encounter for observation for suspected exposure to other biological agents ruled out: Secondary | ICD-10-CM | POA: Diagnosis not present

## 2022-07-30 IMAGING — MG MM DIGITAL SCREENING BILAT W/ TOMO AND CAD
6 of 12 series · 6 of 36 positions shown · non-contrast
Comparison: Previous exam(s).

CLINICAL DATA: Screening.

EXAM:
DIGITAL SCREENING BILATERAL MAMMOGRAM WITH TOMOSYNTHESIS AND CAD
TECHNIQUE: Bilateral screening digital craniocaudal and mediolateral oblique
mammograms were obtained. Bilateral screening digital breast
tomosynthesis was performed. The images were evaluated with
computer-aided detection.

[L XCCL synth-2D]
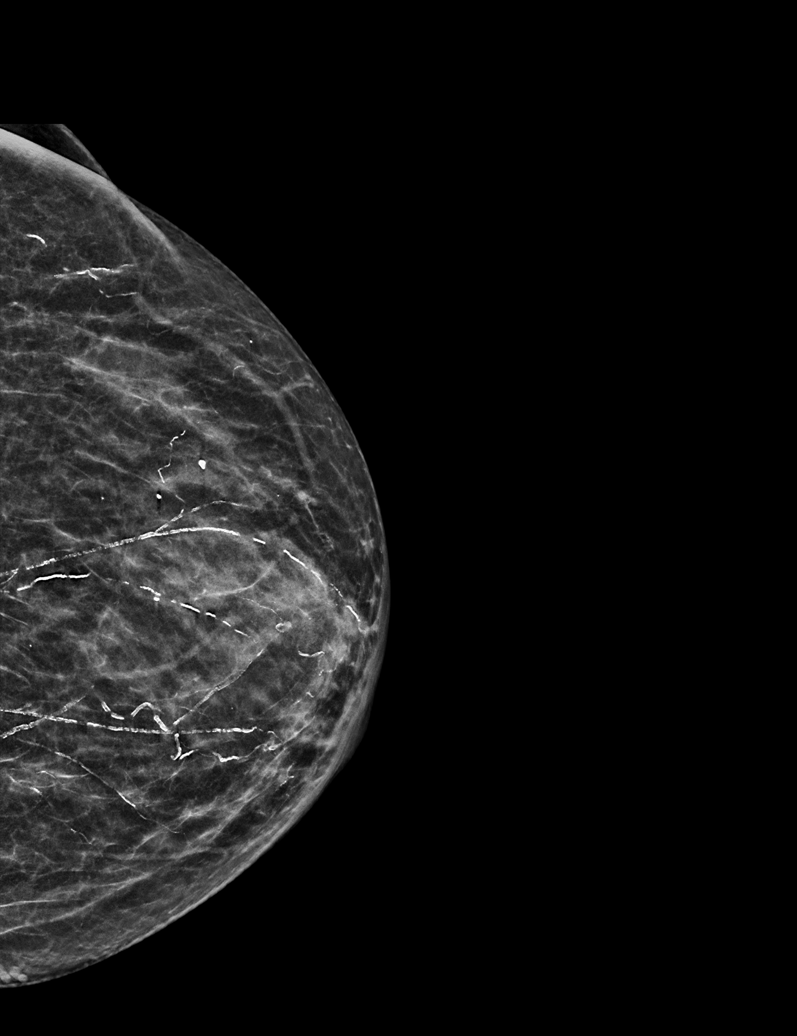

[R MLO synth-2D]
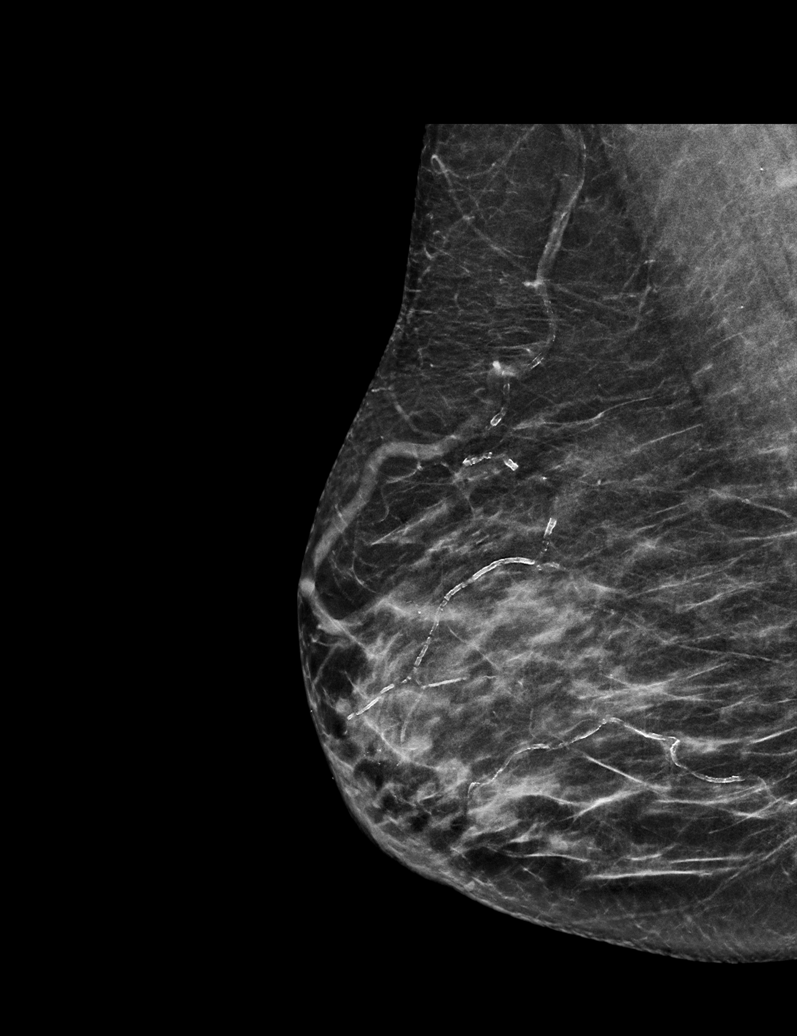

[L MLO synth-2D (1 of 2)]
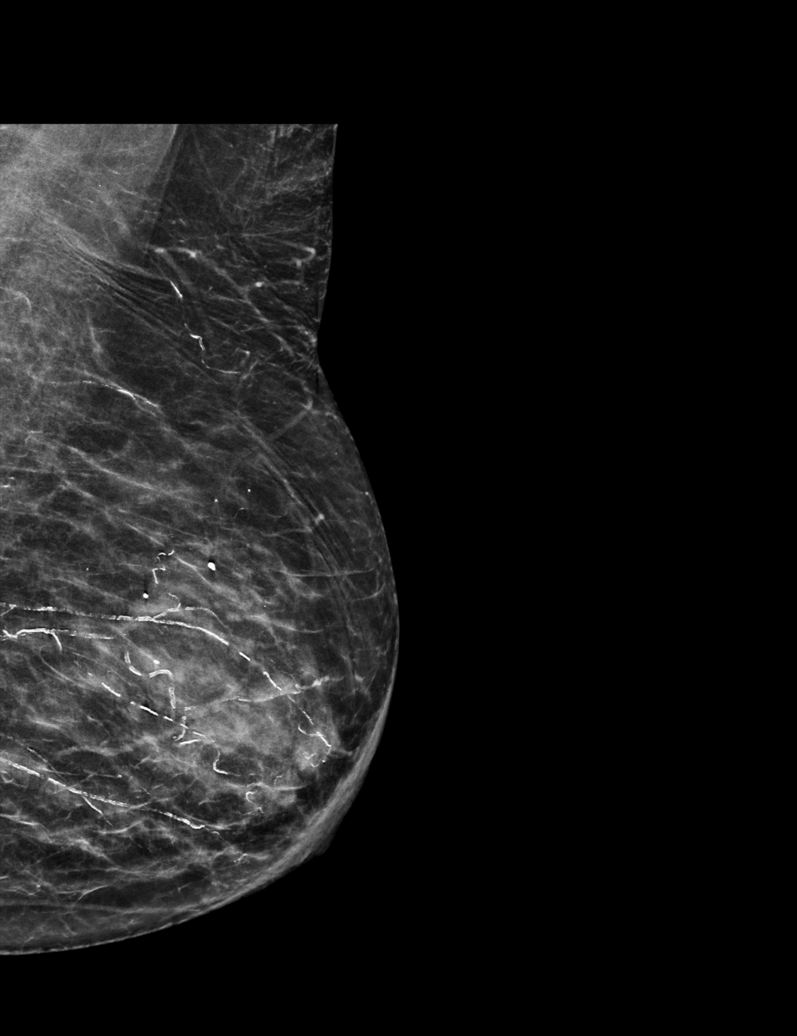

[L CC synth-2D]
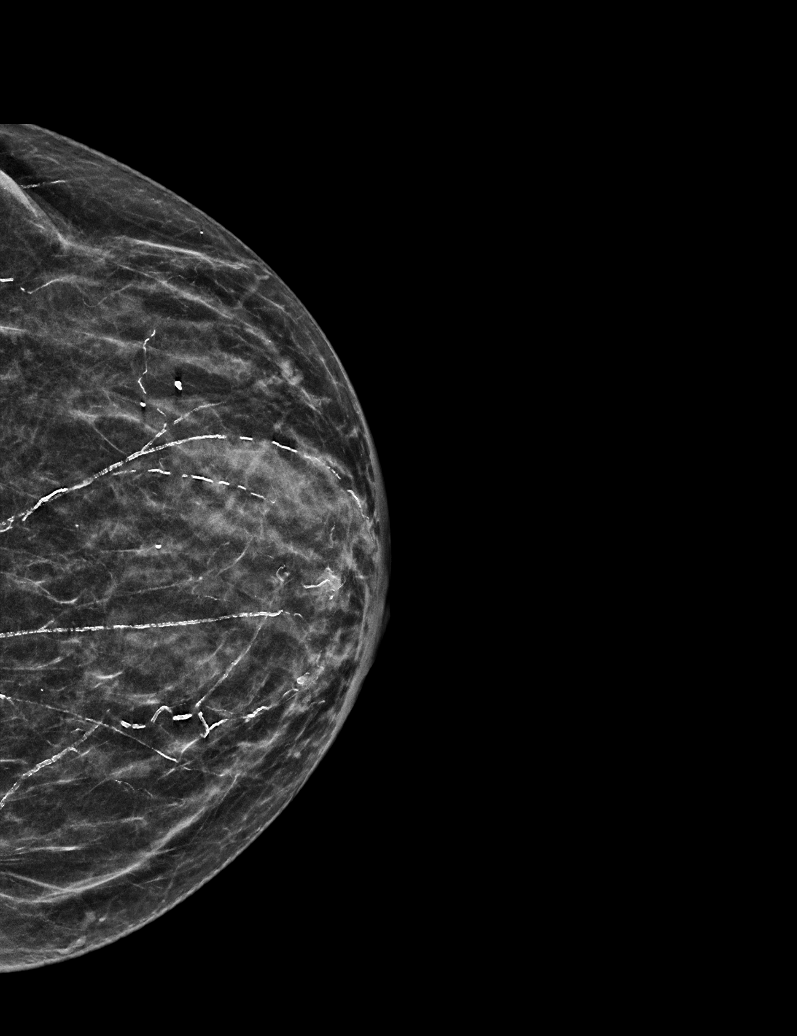

[R CC synth-2D]
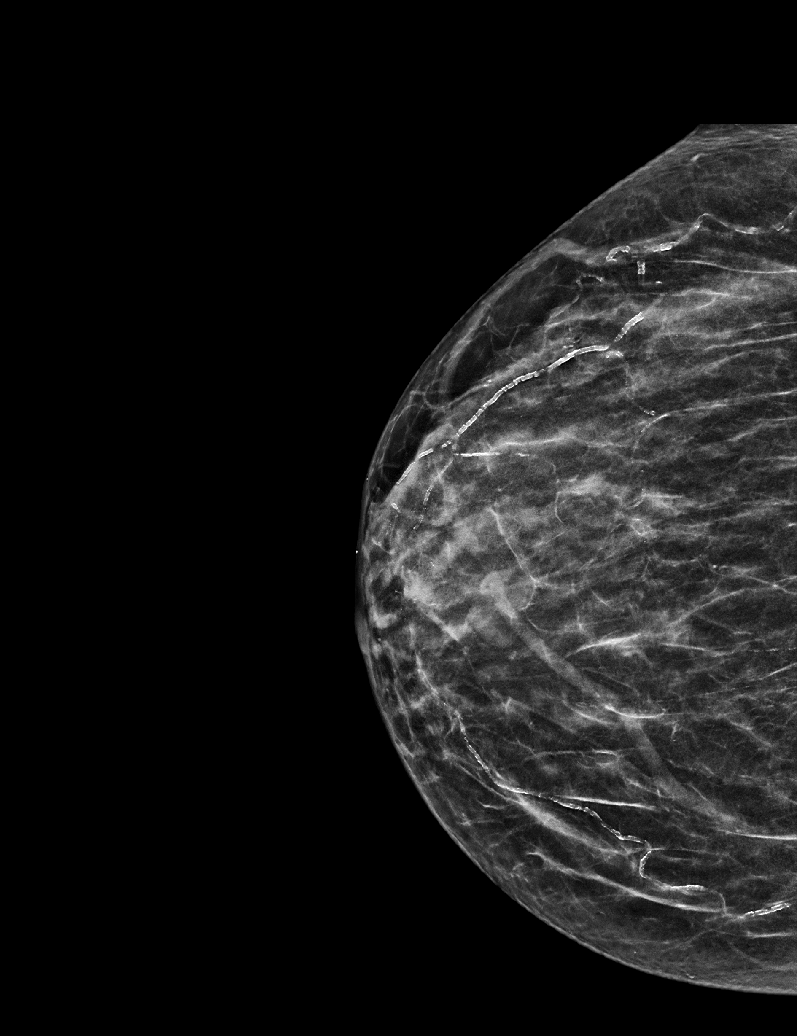

[L MLO synth-2D (2 of 2)]
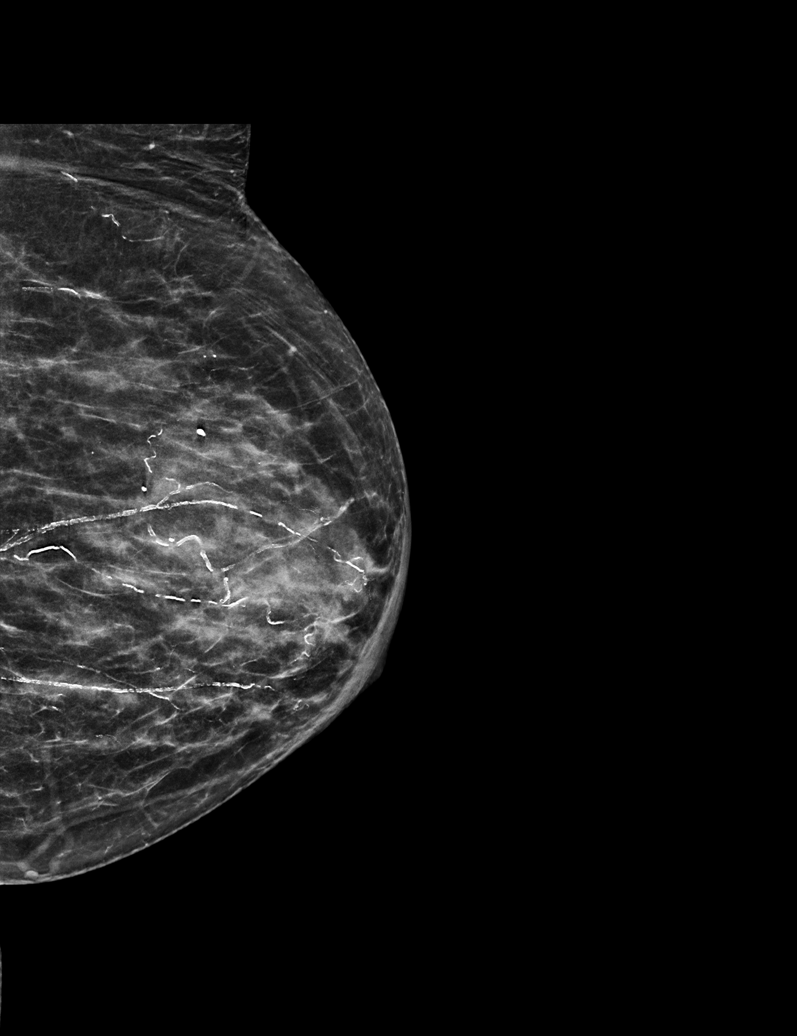

[6 of 36 positions shown; findings below may reference images not displayed]

ACR Breast Density Category c: The breast tissue is heterogeneously
dense, which may obscure small masses.
FINDINGS: There are no findings suspicious for malignancy.
IMPRESSION: No mammographic evidence of malignancy. A result letter of this
screening mammogram will be mailed directly to the patient.

RECOMMENDATION:
Screening mammogram in one year. (Code:Q3-W-BC3)

BI-RADS CATEGORY  1: Negative.

## 2022-08-01 DIAGNOSIS — H2513 Age-related nuclear cataract, bilateral: Secondary | ICD-10-CM | POA: Diagnosis not present

## 2022-08-01 DIAGNOSIS — H401131 Primary open-angle glaucoma, bilateral, mild stage: Secondary | ICD-10-CM | POA: Diagnosis not present

## 2022-08-28 ENCOUNTER — Ambulatory Visit
Admission: RE | Admit: 2022-08-28 | Discharge: 2022-08-28 | Disposition: A | Discharge status: Home / Self Care | Payer: Medicare Other | Source: Ambulatory Visit | Attending: Internal Medicine | Admitting: Internal Medicine

## 2022-08-28 DIAGNOSIS — Z1231 Encounter for screening mammogram for malignant neoplasm of breast: Secondary | ICD-10-CM | POA: Diagnosis not present

## 2022-10-01 DIAGNOSIS — E785 Hyperlipidemia, unspecified: Secondary | ICD-10-CM | POA: Diagnosis not present

## 2022-10-01 DIAGNOSIS — Z79899 Other long term (current) drug therapy: Secondary | ICD-10-CM | POA: Diagnosis not present

## 2022-10-02 ENCOUNTER — Other Ambulatory Visit: Payer: Self-pay | Admitting: *Deleted

## 2022-10-02 LAB — ALT: ALT: 12 IU/L (ref 0–32)

## 2022-11-05 DIAGNOSIS — M5136 Other intervertebral disc degeneration, lumbar region: Secondary | ICD-10-CM | POA: Diagnosis not present

## 2022-11-05 DIAGNOSIS — E039 Hypothyroidism, unspecified: Secondary | ICD-10-CM | POA: Diagnosis not present

## 2022-11-05 DIAGNOSIS — E559 Vitamin D deficiency, unspecified: Secondary | ICD-10-CM | POA: Diagnosis not present

## 2022-11-05 DIAGNOSIS — E7800 Pure hypercholesterolemia, unspecified: Secondary | ICD-10-CM | POA: Diagnosis not present

## 2022-11-05 DIAGNOSIS — Z79899 Other long term (current) drug therapy: Secondary | ICD-10-CM | POA: Diagnosis not present

## 2022-11-05 DIAGNOSIS — M8000XD Age-related osteoporosis with current pathological fracture, unspecified site, subsequent encounter for fracture with routine healing: Secondary | ICD-10-CM | POA: Diagnosis not present

## 2022-11-05 DIAGNOSIS — Z0000 Encounter for general adult medical examination without abnormal findings: Secondary | ICD-10-CM | POA: Diagnosis not present

## 2022-11-05 DIAGNOSIS — I10 Essential (primary) hypertension: Secondary | ICD-10-CM | POA: Diagnosis not present

## 2022-11-05 LAB — LAB REPORT - SCANNED: EGFR: 86

## 2022-12-17 ENCOUNTER — Other Ambulatory Visit: Payer: Self-pay | Admitting: Nurse Practitioner

## 2022-12-17 NOTE — Progress Notes (Unsigned)
Cardiology Office Note:  .   Date:  12/18/2022  ID:  Jenny [name], DOB Apr 26, 1940, MRN [id number] PCP: Thana Ates, MD  [facility] Providers Cardiologist:  Kristeen Miss, MD    Patient Profile: .      PMH Hypertension Hypothyroidism Family history of heart disease Breast cancer Mitral regurgitation Hyperlipidemia  Referred to cardiology and seen by Dr. Elease Hashimoto on 06/01/22.  ED visit 05/03/2018 for hypertension and bradycardia.  EKG showed frequent PVCs.  She was given hydralazine 50 mg to be used as needed.  She reported history of whitecoat hypertension.  No chest pain or shortness of breath.  Riding stationary bike for 30 minutes daily and doing housework with no concerning cardiac symptoms.  In follow-up BP remained elevated and lisinopril was discontinued.  She was started on valsartan 160 mg daily.  Bisoprolol 2.5 mg daily was added for frequent PVCs and hypertension.  She was encouraged to bring her home BP monitor to next visit.  Systolic murmur was noted and an echo completed on 06/29/2022 which revealed normal LV systolic function with EF 60 to 65%, G1 DD, and mild MR.  Seen by me on 07/19/2022.  She denied palpitations, chest pain, shortness of breath.  Home BP generally 120s over 70s, HR 46 to 59 bpm.  LDL at that time was 101.  She was advised to start rosuvastatin 10 mg daily with repeat lipid panel in 2 to 3 months.  LDL improved to 58 on 11/05/2022.       History of Present Illness: .   Jenny [name] is a very pleasant 82 y.o. female who is here today with her daughter for follow-up. She reports good blood pressure control at home, typically in the 120s/70s, but notes that it tends to increase during medical visits. She denies any chest pain, palpitations, shortness of breath, or swelling. She also denies any difficulty getting comfortable while lying down due to breathing issues. She has not experienced any lightheadedness or near syncope. Her cholesterol has  improved significantly on her current medication regimen, with an LDL of 58. She reports no issues with medication tolerance. She has been monitoring her mitral valve regurgitation and has not noticed any changes in symptoms such as chest discomfort or decreased exercise tolerance. She exercises daily on stationary bike without any symptoms. She follows a healthy diet, including daily consumption of Cheerios, blueberries, and strawberries.  ROS: See HPI       Studies Reviewed: .         Risk Assessment/Calculations:     HYPERTENSION CONTROL Vitals:   12/18/22 0852 12/18/22 0906  BP: (!) 186/98 (!) 170/90    The patient's blood pressure is elevated above target today.  In order to address the patient's elevated BP: The blood pressure is usually elevated in clinic.  Blood pressures monitored at home have been optimal.          Physical Exam:   VS:  BP (!) 170/90   Pulse (!) 57   Ht 5\' 4"  (1.626 m)   Wt 165 lb (74.8 kg)   SpO2 96%   BMI 28.32 kg/m    Wt Readings from Last 3 Encounters:  12/18/22 165 lb (74.8 kg)  07/19/22 165 lb 12.8 oz (75.2 kg)  06/01/22 166 lb 6.4 oz (75.5 kg)    GEN: Well nourished, well developed in no acute distress NECK: No JVD; bilateral carotid bruits CARDIAC: RRR, no murmurs, rubs, gallops RESPIRATORY:  Clear to auscultation  without rales, wheezing or rhonchi  ABDOMEN: Soft, non-tender, non-distended EXTREMITIES:  No edema; No deformity     ASSESSMENT AND PLAN: .    Hypertension: BP is quite elevated in the office and very well-controlled at home. She has a history of white coat hypertension.  We will continue current medical therapy including bisoprolol 2.5 mg daily valsartan 160 mg daily and hydralazine 50 mg as needed. She has not used hydralazine in several months.   Palpitations: Quiescent at this time. Feels bisoprolol controls symptoms well. No further concerns. Continue bisoprolol.  Carotid bruit: Bruit noted on auscultation, but no  symptoms of lightheadedness or dizziness. No prior imaging of carotid arteries. We will get carotid artery ultrasound for further evaluation.  Hyperlipidemia LDL goal < 70: LDL 58 on 11/05/22, at goal. Continue Crestor.   Mitral regurgitation: Mild MR on echo 06/29/22. Asymptomatic with no significant murmur on auscultation. No changes in exercise tolerance, chest discomfort, or shortness of breath. Last echocardiogram in April. Continue current management and monitor for any changes in symptoms. Next echocardiogram in 2-3 years unless symptoms change.       Dispo: 6 months with Dr. Elease Hashimoto  Signed, Eligha Bridegroom, NP-C

## 2022-12-18 ENCOUNTER — Encounter: Payer: Self-pay | Admitting: Nurse Practitioner

## 2022-12-18 ENCOUNTER — Ambulatory Visit: Payer: Medicare Other | Attending: Nurse Practitioner | Admitting: Nurse Practitioner

## 2022-12-18 VITALS — BP 170/90 | HR 57 | Ht 64.0 in | Wt 165.0 lb

## 2022-12-18 DIAGNOSIS — I10 Essential (primary) hypertension: Secondary | ICD-10-CM | POA: Diagnosis not present

## 2022-12-18 DIAGNOSIS — I340 Nonrheumatic mitral (valve) insufficiency: Secondary | ICD-10-CM | POA: Diagnosis not present

## 2022-12-18 DIAGNOSIS — E785 Hyperlipidemia, unspecified: Secondary | ICD-10-CM

## 2022-12-18 DIAGNOSIS — R0989 Other specified symptoms and signs involving the circulatory and respiratory systems: Secondary | ICD-10-CM

## 2022-12-18 DIAGNOSIS — R002 Palpitations: Secondary | ICD-10-CM | POA: Diagnosis not present

## 2022-12-18 NOTE — Patient Instructions (Signed)
Medication Instructions:   Your physician recommends that you continue on your current medications as directed. Please refer to the Current Medication list given to you today.   *If you need a refill on your cardiac medications before your next appointment, please call your pharmacy*   Lab Work:  None ordered.  If you have labs (blood work) drawn today and your tests are completely normal, you will receive your results only by: MyChart Message (if you have MyChart) OR A paper copy in the mail If you have any lab test that is abnormal or we need to change your treatment, we will call you to review the results.   Testing/Procedures:  Your physician has requested that you have a carotid duplex. This test is an ultrasound of the carotid arteries in your neck. It looks at blood flow through these arteries that supply the brain with blood. Allow one hour for this exam. There are no restrictions or special instructions.    Follow-Up: At [facility], you and your health needs are our priority.  As part of our continuing mission to provide you with exceptional heart care, we have created designated Provider Care Teams.  These Care Teams include your primary Cardiologist (physician) and Advanced Practice Providers (APPs -  Physician Assistants and Nurse Practitioners) who all work together to provide you with the care you need, when you need it.  We recommend signing up for the patient portal called "MyChart".  Sign up information is provided on this After Visit Summary.  MyChart is used to connect with patients for Virtual Visits (Telemedicine).  Patients are able to view lab/test results, encounter notes, upcoming appointments, etc.  Non-urgent messages can be sent to your provider as well.   To learn more about what you can do with MyChart, go to [email or web address].    Your next appointment:   5 month(s)  Provider:   Kristeen Miss, MD

## 2022-12-27 ENCOUNTER — Ambulatory Visit (HOSPITAL_COMMUNITY)
Admission: RE | Admit: 2022-12-27 | Discharge: 2022-12-27 | Disposition: A | Discharge status: Home / Self Care | Payer: Medicare Other | Source: Ambulatory Visit | Attending: Cardiology | Admitting: Cardiology

## 2022-12-27 DIAGNOSIS — R0989 Other specified symptoms and signs involving the circulatory and respiratory systems: Secondary | ICD-10-CM

## 2023-01-30 DIAGNOSIS — H35371 Puckering of macula, right eye: Secondary | ICD-10-CM | POA: Diagnosis not present

## 2023-01-30 DIAGNOSIS — H2513 Age-related nuclear cataract, bilateral: Secondary | ICD-10-CM | POA: Diagnosis not present

## 2023-01-30 DIAGNOSIS — H401131 Primary open-angle glaucoma, bilateral, mild stage: Secondary | ICD-10-CM | POA: Diagnosis not present

## 2023-02-19 DIAGNOSIS — J069 Acute upper respiratory infection, unspecified: Secondary | ICD-10-CM | POA: Diagnosis not present

## 2023-04-29 DIAGNOSIS — Z79899 Other long term (current) drug therapy: Secondary | ICD-10-CM | POA: Diagnosis not present

## 2023-05-21 ENCOUNTER — Other Ambulatory Visit: Payer: Self-pay | Admitting: Cardiovascular Disease

## 2023-05-21 ENCOUNTER — Other Ambulatory Visit (HOSPITAL_COMMUNITY): Payer: Self-pay | Admitting: *Deleted

## 2023-05-21 DIAGNOSIS — R011 Cardiac murmur, unspecified: Secondary | ICD-10-CM

## 2023-05-21 DIAGNOSIS — I10 Essential (primary) hypertension: Secondary | ICD-10-CM

## 2023-05-21 DIAGNOSIS — R002 Palpitations: Secondary | ICD-10-CM

## 2023-05-22 NOTE — Telephone Encounter (Signed)
*  STAT* If patient is at the pharmacy, call can be transferred to refill team.   1. Which medications need to be refilled? (please list name of each medication and dose if known) Valsartan and Bisopolol   2. Would you like to learn more about the convenience, safety, & potential cost savings by using the [facility]?      3. Are you open to using the [facility] (Type [facility]. .   4. Which pharmacy/location (including street and city if local pharmacy) is medication to be sent to? [facility] [place]  5. Do they need a 30 day or 90 day supply? 90 days and refills

## 2023-05-22 NOTE — Telephone Encounter (Signed)
 Pt's medications were sent to pt's pharmacy as requested. Confirmation received.

## 2023-05-23 ENCOUNTER — Ambulatory Visit (HOSPITAL_COMMUNITY)
Admission: RE | Admit: 2023-05-23 | Discharge: 2023-05-23 | Disposition: A | Discharge status: Home / Self Care | Source: Ambulatory Visit | Attending: Internal Medicine | Admitting: Internal Medicine

## 2023-05-23 DIAGNOSIS — M810 Age-related osteoporosis without current pathological fracture: Secondary | ICD-10-CM | POA: Diagnosis not present

## 2023-05-23 MED ORDER — ZOLEDRONIC ACID 5 MG/100ML IV SOLN
INTRAVENOUS | Status: AC
  Filled 2023-05-23: qty 100

## 2023-05-23 MED ORDER — DENOSUMAB 60 MG/ML ~~LOC~~ SOSY: 60.0000 mg | PREFILLED_SYRINGE | Freq: Once | SUBCUTANEOUS | Status: DC

## 2023-05-23 MED ORDER — ZOLEDRONIC ACID 5 MG/100ML IV SOLN
5.0000 mg | Freq: Once | INTRAVENOUS | Status: AC
  Administered 2023-05-23: 5 mg via INTRAVENOUS

## 2023-06-03 ENCOUNTER — Encounter: Payer: Self-pay | Admitting: Cardiovascular Disease

## 2023-06-03 NOTE — Progress Notes (Unsigned)
 Cardiology Office Note:    Date:  2023/07/04   ID:  Jenny [name], DOB 01/02/1941, MRN [id number]  PCP:  Thana Ates, MD   [facility] Providers Cardiologist: Dock Baccam  1}    Referring MD: Thana Ates, MD   Chief Complaint  Patient presents with   Hypertension    History of Present Illness:    Jenny [name] is a 83 y.o. female with a hx of HTN, hypothyridism.  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       Past Medical History:  Diagnosis Date   Breast cancer (HCC)    Cancer (HCC) 18 YRS    LEFT BREAST   History of kidney stones LAST FEB 2017   Hypertension    Hypothyroidism    Personal history of radiation therapy     Past Surgical History:  Procedure Laterality Date   ABDOMINAL HYSTERECTOMY     PARTIAL   BREAST BIOPSY     BREAST LUMPECTOMY Left    1998   CHOLECYSTECTOMY     COLONOSCOPY WITH PROPOFOL N/A 09/06/2015   Procedure: COLONOSCOPY WITH PROPOFOL;  Surgeon: Charolett Bumpers, MD;  Location: [facility];  Service: Endoscopy;  Laterality: N/A;   CYSTOCELE REPAIR N/A 11/15/2020   Procedure: ANTERIOR REPAIR (CYSTOCELE) / CYSTOSCOPY;  Surgeon: Alfredo Martinez, MD;  Location: [facility];  Service: Urology;  Laterality: N/A;  REQUESTING 2.5 HRS   LEFT BREAST LUMPECTOMY  18 YRS AGO   RADIATION DONE   STENT PLACE LEFT URETER ([facility] HX)  9 YRS AGO    Current Medications: Current Meds  Medication Sig   bisoprolol (ZEBETA) 5 MG tablet TAKE 1/2 TABLET BY MOUTH DAILY   dorzolamide-timolol (COSOPT) 22.3-6.8 MG/ML  ophthalmic solution Place 1 drop into both eyes 2 (two) times daily.   hydrALAZINE (APRESOLINE) 50 MG tablet Take 50 mg by mouth as needed.   latanoprost (XALATAN) 0.005 % ophthalmic solution Place 1 drop into both eyes at bedtime.   Multiple Vitamin (MULTIVITAMIN WITH MINERALS) TABS tablet Take 1 tablet by mouth daily.   rosuvastatin (CRESTOR) 10 MG tablet TAKE 1 TABLET BY MOUTH EVERY DAY IN THE EVENING   SYNTHROID 100 MCG tablet Take 100 mcg by mouth daily before breakfast.    valsartan (DIOVAN) 160 MG tablet TAKE 1 TABLET BY MOUTH EVERY DAY     Allergies:   Patient has no known allergies.   Social History   Socioeconomic History   Marital status: Married    Spouse name: Not on file   Number of children: Not on file   Years of education: Not on file   Highest education level: Not on file  Occupational History   Not on file  Tobacco Use   Smoking status: Never   Smokeless tobacco: Never  Vaping Use   Vaping status: Never Used  Substance and Sexual Activity   Alcohol use: No  Drug use: No   Sexual activity: Not on file  Other Topics Concern   Not on file  Social History Narrative   Not on file   Social Drivers of Health   Financial Resource Strain: Not on file  Food Insecurity: Not on file  Transportation Needs: Not on file  Physical Activity: Not on file  Stress: Not on file  Social Connections: Not on file     Family History: The patient's family history includes Breast cancer in her sister.  ROS:   Please see the history of present illness.     All other systems reviewed and are negative.  EKGs/Labs/Other Studies Reviewed:    The following studies were reviewed today:   EKG:     EKG Interpretation Date/Time:  Tuesday June 04 2023 08:14:00 EDT Ventricular Rate:  54 PR Interval:  148 QRS Duration:  78 QT Interval:  426 QTC Calculation: 403 R Axis:   5  Text Interpretation: Sinus bradycardia with sinus arrhythmia When compared with ECG of  03-May-2022 19:08, HR is slower and the PVCs have resolved since prior tracing Confirmed by Kristeen Miss ([id number]) on 06/04/2023 8:26:49 AM    Recent Labs: 07/19/2022: BUN 16; Creatinine, Ser 0.77; Potassium 4.8; Sodium 140 10/01/2022: ALT 12  Recent Lipid Panel    Component Value Date/Time   CHOL 169 07/19/2022 1029   TRIG 89 07/19/2022 1029   HDL 52 07/19/2022 1029   CHOLHDL 3.3 07/19/2022 1029   LDLCALC 101 (H) 07/19/2022 1029     Risk Assessment/Calculations:       Physical Exam:    Physical Exam: Blood pressure (!) 158/90, pulse (!) 51, height 5\' 4"  (1.626 m), weight 170 lb 3.2 oz (77.2 kg), SpO2 99%.  HYPERTENSION CONTROL Vitals:   06/04/23 0810 06/04/23 1418  BP: (!) 190/110 (!) 158/90    The patient's blood pressure is elevated above target today.  In order to address the patient's elevated BP: Blood pressure will be monitored at home to determine if medication changes need to be made.       GEN:  Well nourished, well developed in no acute distress HEENT: Normal NECK: No JVD; No carotid bruits LYMPHATICS: No lymphadenopathy CARDIAC: RRR ,  very soft systolic murmur  RESPIRATORY:  Clear to auscultation without rales, wheezing or rhonchi  ABDOMEN: Soft, non-tender, non-distended MUSCULOSKELETAL:  No edema; No deformity  SKIN: Warm and dry NEUROLOGIC:  Alert and oriented x 3    ASSESSMENT:    1. Follow-up exam   2. Medication management   3. Essential hypertension   4. Hyperlipidemia LDL goal <70     PLAN:      Hypertension:  BP is well controlled at home .   Always elevated at the doctors office .  She avoids excessive salt.  She rides her stationary bike regularly.    2.  Heart murmur:  benign .    3.  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   The PVCs have improved       Medication Adjustments/Labs and Tests Ordered: Current medicines are reviewed at length with the patient today.   Concerns regarding medicines are outlined above.  Orders Placed This Encounter  Procedures   Basic metabolic panel   EKG 12-Lead   No orders of the defined types were placed in this encounter.   Patient Instructions  Lab Work: BMET today If you have labs (blood work) drawn today and your tests are completely normal, you  will receive your results only by: MyChart Message (if you have MyChart) OR A paper copy in the mail If you have any lab test that is abnormal or we need to change your treatment, we will call you to review the results.  Follow-Up: At [facility], you and your health needs are our priority.  As part of our continuing mission to provide you with exceptional heart care, we have created designated Provider Care Teams.  These Care Teams include your primary Cardiologist (physician) and Advanced Practice Providers (APPs -  Physician Assistants and Nurse Practitioners) who all work together to provide you with the care you need, when you need it.  Your next appointment:   1 year(s)  Provider:   Kristeen Miss, MD      1st Floor: - Lobby - Registration  - Pharmacy  - Lab - Cafe  2nd Floor: - PV Lab - Diagnostic Testing (echo, CT, nuclear med)  3rd Floor: - Vacant  4th Floor: - TCTS (cardiothoracic surgery) - AFib Clinic - Structural Heart Clinic - Vascular Surgery  - Vascular Ultrasound  5th Floor: - HeartCare Cardiology (general and EP) - Clinical Pharmacy for coumadin, hypertension, lipid, weight-loss medications, and med management appointments    Valet parking services will be available as well.     Signed, Kristeen Miss, MD  06/04/2023 2:19 PM    [facility]

## 2023-06-04 ENCOUNTER — Encounter: Payer: Self-pay | Admitting: Cardiovascular Disease

## 2023-06-04 ENCOUNTER — Ambulatory Visit: Payer: Medicare Other | Attending: Cardiovascular Disease | Admitting: Cardiovascular Disease

## 2023-06-04 VITALS — BP 158/90 | HR 51 | Ht 64.0 in | Wt 170.2 lb

## 2023-06-04 DIAGNOSIS — Z79899 Other long term (current) drug therapy: Secondary | ICD-10-CM

## 2023-06-04 DIAGNOSIS — Z09 Encounter for follow-up examination after completed treatment for conditions other than malignant neoplasm: Secondary | ICD-10-CM

## 2023-06-04 DIAGNOSIS — E785 Hyperlipidemia, unspecified: Secondary | ICD-10-CM | POA: Diagnosis not present

## 2023-06-04 DIAGNOSIS — I10 Essential (primary) hypertension: Secondary | ICD-10-CM

## 2023-06-04 LAB — BASIC METABOLIC PANEL
BUN/Creatinine Ratio: 19 (ref 12–28)
BUN: 15 mg/dL (ref 8–27)
CO2: 26 mmol/L (ref 20–29)
Calcium: 9.7 mg/dL (ref 8.7–10.3)
Chloride: 107 mmol/L — ABNORMAL HIGH (ref 96–106)
Creatinine, Ser: 0.79 mg/dL (ref 0.57–1.00)
Glucose: 90 mg/dL (ref 70–99)
Potassium: 5 mmol/L (ref 3.5–5.2)
Sodium: 143 mmol/L (ref 134–144)
eGFR: 76 mL/min/{1.73_m2} (ref >59)

## 2023-06-04 NOTE — Patient Instructions (Signed)
 Lab Work: BMET today If you have labs (blood work) drawn today and your tests are completely normal, you will receive your results only by: [employer] (if you have MyChart) OR A paper copy in the mail If you have any lab test that is abnormal or we need to change your treatment, we will call you to review the results.  Follow-Up: At [facility], you and your health needs are our priority.  As part of our continuing mission to provide you with exceptional heart care, we have created designated Provider Care Teams.  These Care Teams include your primary Cardiologist (physician) and Advanced Practice Providers (APPs -  Physician Assistants and Nurse Practitioners) who all work together to provide you with the care you need, when you need it.  Your next appointment:   1 year(s)  Provider:   Kristeen Miss, MD      1st Floor: - Lobby - Registration  - Pharmacy  - Lab - Cafe  2nd Floor: - PV Lab - Diagnostic Testing (echo, CT, nuclear med)  3rd Floor: - Vacant  4th Floor: - TCTS (cardiothoracic surgery) - AFib Clinic - Structural Heart Clinic - Vascular Surgery  - Vascular Ultrasound  5th Floor: - HeartCare Cardiology (general and EP) - Clinical Pharmacy for coumadin, hypertension, lipid, weight-loss medications, and med management appointments    Valet parking services will be available as well.

## 2023-07-22 ENCOUNTER — Other Ambulatory Visit: Payer: Self-pay | Admitting: Internal Medicine

## 2023-07-22 DIAGNOSIS — Z0000 Encounter for general adult medical examination without abnormal findings: Secondary | ICD-10-CM

## 2023-08-07 DIAGNOSIS — H2513 Age-related nuclear cataract, bilateral: Secondary | ICD-10-CM | POA: Diagnosis not present

## 2023-08-07 DIAGNOSIS — H401131 Primary open-angle glaucoma, bilateral, mild stage: Secondary | ICD-10-CM | POA: Diagnosis not present

## 2023-08-30 ENCOUNTER — Ambulatory Visit
Admission: RE | Admit: 2023-08-30 | Discharge: 2023-08-30 | Disposition: A | Discharge status: Home / Self Care | Source: Ambulatory Visit | Attending: Internal Medicine | Admitting: Internal Medicine

## 2023-08-30 DIAGNOSIS — Z0000 Encounter for general adult medical examination without abnormal findings: Secondary | ICD-10-CM

## 2023-08-30 DIAGNOSIS — Z1231 Encounter for screening mammogram for malignant neoplasm of breast: Secondary | ICD-10-CM | POA: Diagnosis not present

## 2023-09-03 DIAGNOSIS — D2362 Other benign neoplasm of skin of left upper limb, including shoulder: Secondary | ICD-10-CM | POA: Diagnosis not present

## 2023-09-03 DIAGNOSIS — L814 Other melanin hyperpigmentation: Secondary | ICD-10-CM | POA: Diagnosis not present

## 2023-09-03 DIAGNOSIS — L820 Inflamed seborrheic keratosis: Secondary | ICD-10-CM | POA: Diagnosis not present

## 2023-09-03 DIAGNOSIS — L218 Other seborrheic dermatitis: Secondary | ICD-10-CM | POA: Diagnosis not present

## 2023-09-03 DIAGNOSIS — D1801 Hemangioma of skin and subcutaneous tissue: Secondary | ICD-10-CM | POA: Diagnosis not present

## 2023-09-03 DIAGNOSIS — L817 Pigmented purpuric dermatosis: Secondary | ICD-10-CM | POA: Diagnosis not present

## 2023-09-03 DIAGNOSIS — L570 Actinic keratosis: Secondary | ICD-10-CM | POA: Diagnosis not present

## 2023-09-03 DIAGNOSIS — L821 Other seborrheic keratosis: Secondary | ICD-10-CM | POA: Diagnosis not present

## 2023-09-03 DIAGNOSIS — D225 Melanocytic nevi of trunk: Secondary | ICD-10-CM | POA: Diagnosis not present

## 2023-09-03 DIAGNOSIS — D235 Other benign neoplasm of skin of trunk: Secondary | ICD-10-CM | POA: Diagnosis not present

## 2023-10-15 ENCOUNTER — Telehealth: Payer: Self-pay | Admitting: Nurse Practitioner

## 2023-10-15 MED ORDER — ROSUVASTATIN CALCIUM 10 MG PO TABS
10.0000 mg | ORAL_TABLET | Freq: Every day | ORAL | 2 refills | Status: AC
Start: 2023-10-15 — End: ?

## 2023-10-15 NOTE — Telephone Encounter (Signed)
*  STAT* If patient is at the pharmacy, call can be transferred to refill team.   1. Which medications need to be refilled? (please list name of each medication and dose if known)   rosuvastatin  (CRESTOR ) 10 MG tablet   2. Which pharmacy/location (including street and city if local pharmacy) is medication to be sent to? [id number] [place],  - [address] Phone: [phone]  Fax: [phone]     3. Do they need a 30 day or 90 day supply? 90  Pt is out of medication

## 2023-11-08 DIAGNOSIS — Z79899 Other long term (current) drug therapy: Secondary | ICD-10-CM | POA: Diagnosis not present

## 2023-11-08 DIAGNOSIS — E559 Vitamin D deficiency, unspecified: Secondary | ICD-10-CM | POA: Diagnosis not present

## 2023-11-08 DIAGNOSIS — E039 Hypothyroidism, unspecified: Secondary | ICD-10-CM | POA: Diagnosis not present

## 2023-11-08 DIAGNOSIS — M8000XD Age-related osteoporosis with current pathological fracture, unspecified site, subsequent encounter for fracture with routine healing: Secondary | ICD-10-CM | POA: Diagnosis not present

## 2023-11-08 DIAGNOSIS — I10 Essential (primary) hypertension: Secondary | ICD-10-CM | POA: Diagnosis not present

## 2023-11-08 DIAGNOSIS — M25562 Pain in left knee: Secondary | ICD-10-CM | POA: Diagnosis not present

## 2023-11-08 DIAGNOSIS — Z0000 Encounter for general adult medical examination without abnormal findings: Secondary | ICD-10-CM | POA: Diagnosis not present

## 2023-11-08 DIAGNOSIS — M5136 Other intervertebral disc degeneration, lumbar region with discogenic back pain only: Secondary | ICD-10-CM | POA: Diagnosis not present

## 2023-11-08 DIAGNOSIS — E7800 Pure hypercholesterolemia, unspecified: Secondary | ICD-10-CM | POA: Diagnosis not present

## 2023-12-10 DIAGNOSIS — M25562 Pain in left knee: Secondary | ICD-10-CM | POA: Diagnosis not present

## 2024-02-13 ENCOUNTER — Telehealth: Payer: Self-pay | Admitting: Internal Medicine

## 2024-02-13 DIAGNOSIS — I10 Essential (primary) hypertension: Secondary | ICD-10-CM

## 2024-02-13 DIAGNOSIS — R002 Palpitations: Secondary | ICD-10-CM

## 2024-02-13 DIAGNOSIS — R011 Cardiac murmur, unspecified: Secondary | ICD-10-CM

## 2024-02-13 MED ORDER — VALSARTAN 160 MG PO TABS: 160.0000 mg | ORAL_TABLET | Freq: Every day | ORAL | 0 refills | Status: AC

## 2024-02-13 MED ORDER — BISOPROLOL FUMARATE 5 MG PO TABS: 2.5000 mg | ORAL_TABLET | Freq: Every day | ORAL | 0 refills | Status: AC

## 2024-02-13 NOTE — Telephone Encounter (Signed)
 Pt's medications were sent to pt's pharmacy as requested. Confirmation received.

## 2024-02-13 NOTE — Telephone Encounter (Signed)
*  STAT* If patient is at the pharmacy, call can be transferred to refill team.   1. Which medications need to be refilled? (please list name of each medication and dose if known)  bisoprolol  (ZEBETA ) 5 MG tablet valsartan  (DIOVAN ) 160 MG tablet   2. Which pharmacy/location (including street and city if local pharmacy) is medication to be sent to? [id number] - [place], [place] - [address]   3. Do they need a 30 day or 90 day supply?  90 day supply

## 2024-06-05 ENCOUNTER — Ambulatory Visit: Admitting: Internal Medicine
